# Patient Record
Sex: Male | Born: 1980 | Hispanic: Yes | Marital: Single | Smoking: Never smoker
Health system: Southern US, Community
[De-identification: ages and names within clinical notes are randomized; demographics above are authoritative.]

## PROBLEM LIST (undated history)

## (undated) DIAGNOSIS — E119 Type 2 diabetes mellitus without complications: Secondary | ICD-10-CM

## (undated) DIAGNOSIS — I10 Essential (primary) hypertension: Secondary | ICD-10-CM

## (undated) DIAGNOSIS — Z87898 Personal history of other specified conditions: Secondary | ICD-10-CM

## (undated) DIAGNOSIS — E785 Hyperlipidemia, unspecified: Secondary | ICD-10-CM

---

## 2018-11-05 ENCOUNTER — Encounter (HOSPITAL_COMMUNITY): Payer: Self-pay

## 2018-11-05 ENCOUNTER — Other Ambulatory Visit: Payer: Self-pay

## 2018-11-05 ENCOUNTER — Emergency Department (HOSPITAL_COMMUNITY)
Admission: EM | Admit: 2018-11-05 | Discharge: 2018-11-05 | Disposition: A | Discharge status: Home / Self Care | Payer: Self-pay | Attending: Emergency Medicine | Admitting: Emergency Medicine

## 2018-11-05 DIAGNOSIS — R6884 Jaw pain: Secondary | ICD-10-CM | POA: Insufficient documentation

## 2018-11-05 DIAGNOSIS — Z5321 Procedure and treatment not carried out due to patient leaving prior to being seen by health care provider: Secondary | ICD-10-CM | POA: Insufficient documentation

## 2018-11-05 DIAGNOSIS — H9202 Otalgia, left ear: Secondary | ICD-10-CM | POA: Insufficient documentation

## 2018-11-05 HISTORY — DX: Type 2 diabetes mellitus without complications: E11.9

## 2018-11-05 NOTE — ED Triage Notes (Signed)
Pt arrived with complaints of left ear pain that started about a month ago, per pt pain radiates to his left jaw and worsens when he eats.

## 2019-01-13 ENCOUNTER — Encounter (HOSPITAL_COMMUNITY): Payer: Self-pay | Admitting: Emergency Medicine

## 2019-01-13 ENCOUNTER — Other Ambulatory Visit: Payer: Self-pay

## 2019-01-13 ENCOUNTER — Emergency Department (HOSPITAL_COMMUNITY): Payer: Self-pay

## 2019-01-13 ENCOUNTER — Inpatient Hospital Stay (HOSPITAL_COMMUNITY)
Admission: EM | Admit: 2019-01-13 | Discharge: 2019-01-16 | DRG: 637 | Disposition: A | Discharge status: Home / Self Care | Payer: Self-pay | Attending: Internal Medicine | Admitting: Internal Medicine

## 2019-01-13 DIAGNOSIS — S43025A Posterior dislocation of left humerus, initial encounter: Secondary | ICD-10-CM | POA: Diagnosis present

## 2019-01-13 DIAGNOSIS — R569 Unspecified convulsions: Secondary | ICD-10-CM | POA: Diagnosis present

## 2019-01-13 DIAGNOSIS — E1110 Type 2 diabetes mellitus with ketoacidosis without coma: Principal | ICD-10-CM

## 2019-01-13 DIAGNOSIS — Z20828 Contact with and (suspected) exposure to other viral communicable diseases: Secondary | ICD-10-CM | POA: Diagnosis present

## 2019-01-13 DIAGNOSIS — G9341 Metabolic encephalopathy: Secondary | ICD-10-CM | POA: Diagnosis present

## 2019-01-13 DIAGNOSIS — W19XXXA Unspecified fall, initial encounter: Secondary | ICD-10-CM | POA: Diagnosis present

## 2019-01-13 DIAGNOSIS — S43024A Posterior dislocation of right humerus, initial encounter: Secondary | ICD-10-CM | POA: Diagnosis present

## 2019-01-13 DIAGNOSIS — S43022A Posterior subluxation of left humerus, initial encounter: Secondary | ICD-10-CM

## 2019-01-13 DIAGNOSIS — E1310 Other specified diabetes mellitus with ketoacidosis without coma: Secondary | ICD-10-CM

## 2019-01-13 DIAGNOSIS — R52 Pain, unspecified: Secondary | ICD-10-CM

## 2019-01-13 DIAGNOSIS — Z794 Long term (current) use of insulin: Secondary | ICD-10-CM

## 2019-01-13 DIAGNOSIS — S43021A Posterior subluxation of right humerus, initial encounter: Secondary | ICD-10-CM

## 2019-01-13 DIAGNOSIS — E119 Type 2 diabetes mellitus without complications: Secondary | ICD-10-CM

## 2019-01-13 DIAGNOSIS — Z87898 Personal history of other specified conditions: Secondary | ICD-10-CM

## 2019-01-13 DIAGNOSIS — D72829 Elevated white blood cell count, unspecified: Secondary | ICD-10-CM | POA: Diagnosis present

## 2019-01-13 DIAGNOSIS — I10 Essential (primary) hypertension: Secondary | ICD-10-CM | POA: Diagnosis present

## 2019-01-13 DIAGNOSIS — R55 Syncope and collapse: Secondary | ICD-10-CM | POA: Diagnosis present

## 2019-01-13 HISTORY — DX: Personal history of other specified conditions: Z87.898

## 2019-01-13 HISTORY — DX: Essential (primary) hypertension: I10

## 2019-01-13 LAB — URINALYSIS, ROUTINE W REFLEX MICROSCOPIC
Bilirubin Urine: NEGATIVE
Glucose, UA: >=500 mg/dL — AB
Ketones, ur: 20 mg/dL — AB
Leukocytes,Ua: NEGATIVE
Nitrite: NEGATIVE
Protein, ur: 30 mg/dL — AB
Specific Gravity, Urine: 1.02 (ref 1.005–1.030)
pH: 5 (ref 5.0–8.0)

## 2019-01-13 LAB — CBG MONITORING, ED
Glucose-Capillary: 334 mg/dL — ABNORMAL HIGH (ref 70–99)
Glucose-Capillary: 336 mg/dL — ABNORMAL HIGH (ref 70–99)
Glucose-Capillary: 336 mg/dL — ABNORMAL HIGH (ref 70–99)

## 2019-01-13 LAB — POCT I-STAT EG7
Acid-base deficit: 2 mmol/L (ref 0.0–2.0)
Bicarbonate: 22.8 mmol/L (ref 20.0–28.0)
Calcium, Ion: 1.11 mmol/L — ABNORMAL LOW (ref 1.15–1.40)
HCT: 52 % (ref 39.0–52.0)
Hemoglobin: 17.7 g/dL — ABNORMAL HIGH (ref 13.0–17.0)
O2 Saturation: 62 %
Potassium: 4.4 mmol/L (ref 3.5–5.1)
Sodium: 134 mmol/L — ABNORMAL LOW (ref 135–145)
TCO2: 24 mmol/L (ref 22–32)
pCO2, Ven: 39.8 mmHg — ABNORMAL LOW (ref 44.0–60.0)
pH, Ven: 7.367 (ref 7.250–7.430)
pO2, Ven: 33 mmHg (ref 32.0–45.0)

## 2019-01-13 LAB — CBC
HCT: 48.8 % (ref 39.0–52.0)
Hemoglobin: 17.3 g/dL — ABNORMAL HIGH (ref 13.0–17.0)
MCH: 31.2 pg (ref 26.0–34.0)
MCHC: 35.5 g/dL (ref 30.0–36.0)
MCV: 87.9 fL (ref 80.0–100.0)
Platelets: 364 10*3/uL (ref 150–400)
RBC: 5.55 MIL/uL (ref 4.22–5.81)
RDW: 11.8 % (ref 11.5–15.5)
WBC: 14.5 10*3/uL — ABNORMAL HIGH (ref 4.0–10.5)
nRBC: 0 % (ref 0.0–0.2)

## 2019-01-13 LAB — BASIC METABOLIC PANEL
Anion gap: 16 — ABNORMAL HIGH (ref 5–15)
BUN: 14 mg/dL (ref 6–20)
CO2: 19 mmol/L — ABNORMAL LOW (ref 22–32)
Calcium: 9.8 mg/dL (ref 8.9–10.3)
Chloride: 97 mmol/L — ABNORMAL LOW (ref 98–111)
Creatinine, Ser: 1.01 mg/dL (ref 0.61–1.24)
GFR calc Af Amer: >60 mL/min (ref >60)
GFR calc non Af Amer: >60 mL/min (ref >60)
Glucose, Bld: 385 mg/dL — ABNORMAL HIGH (ref 70–99)
Potassium: 4 mmol/L (ref 3.5–5.1)
Sodium: 132 mmol/L — ABNORMAL LOW (ref 135–145)

## 2019-01-13 MED ORDER — FENTANYL CITRATE (PF) 100 MCG/2ML IJ SOLN
50.0000 ug | Freq: Once | INTRAMUSCULAR | Status: AC
  Administered 2019-01-13: 50 ug via INTRAVENOUS
  Filled 2019-01-13: qty 2

## 2019-01-13 MED ORDER — DEXTROSE-NACL 5-0.45 % IV SOLN: INTRAVENOUS | Status: DC

## 2019-01-13 MED ORDER — INSULIN REGULAR(HUMAN) IN NACL 100-0.9 UT/100ML-% IV SOLN
INTRAVENOUS | Status: DC
  Administered 2019-01-13: 2.8 [IU]/h via INTRAVENOUS
  Filled 2019-01-13: qty 100

## 2019-01-13 MED ORDER — LACTATED RINGERS IV BOLUS
1000.0000 mL | Freq: Once | INTRAVENOUS | Status: AC
  Administered 2019-01-13: 1000 mL via INTRAVENOUS

## 2019-01-13 MED ORDER — PROPOFOL 10 MG/ML IV BOLUS
INTRAVENOUS | Status: AC | PRN
  Administered 2019-01-13: 20 mg via INTRAVENOUS
  Administered 2019-01-13: 35 mg via INTRAVENOUS
  Administered 2019-01-13: 30 mg via INTRAVENOUS

## 2019-01-13 MED ORDER — PROPOFOL 10 MG/ML IV BOLUS
150.0000 mg | Freq: Once | INTRAVENOUS | Status: AC
  Administered 2019-01-13: 22:00:00 via INTRAVENOUS
  Filled 2019-01-13: qty 20

## 2019-01-13 MED ORDER — PROPOFOL 10 MG/ML IV BOLUS
INTRAVENOUS | Status: AC | PRN
  Administered 2019-01-13: 75 mg via INTRAVENOUS
  Administered 2019-01-13: 20 mg via INTRAVENOUS

## 2019-01-13 MED ORDER — SODIUM CHLORIDE 0.9% FLUSH: 3.0000 mL | Freq: Once | INTRAVENOUS | Status: DC

## 2019-01-13 MED ORDER — LACTATED RINGERS IV BOLUS
1000.0000 mL | Freq: Once | INTRAVENOUS | Status: AC
  Administered 2019-01-13: 22:00:00 1000 mL via INTRAVENOUS

## 2019-01-13 NOTE — ED Notes (Signed)
Patient transported to CT and X ray 

## 2019-01-13 NOTE — H&P (Signed)
History and Physical    Alan Alvarez GEX:[id number] DOB: 1981-01-16 DOA: 01/13/2019  PCP: Patient, No Pcp Per   Patient coming from: Home   Chief Complaint: LOC, bilateral shoulder pain   HPI: Alan Alvarez is a 38 y.o. male with medical history significant for type 2 diabetes mellitus, hypertension, and seizure, presenting to the emergency department after a loss of consciousness and complaining of bilateral shoulder pain.  Patient reports that he is been in his usual state of health, had an uneventful day, was sitting down and eating when he suffered an acute loss of consciousness.  Patient woke up with bilateral shoulder pain.  He felt dizzy just prior to the event.  There were no witnesses.  Patient reports having a seizure approximately 10 years ago, is not sure of the etiology, but states that he has never been on antiepileptics.  There was no associated chest pain or palpitations and the patient denies any recent fevers, chills, cough, or shortness of breath.  He denies any alcohol or illicit substance use.  ED Course: Upon arrival to the ED, patient is found to be afebrile, saturating well on room air, and with stable blood pressure.  EKG features sinus rhythm with RBBB and LP FB.  Noncontrast head CT is a normal study.  Radiographs of the shoulders demonstrates bilateral posterior dislocations.  Chemistry panel features a glucose of 385 with elevated anion gap and low bicarbonate.  CBC features of leukocytosis 14,500.  Urinalysis notable for glucosuria and ketonuria.  Patient was given 2 L lactated Ringer's, fentanyl, and started on insulin infusion in the emergency department.  Bilateral shoulders were successfully reduced in the ED.  COVID-19 testing is in process.  Review of Systems:  All other systems reviewed and apart from HPI, are negative.  Past Medical History:  Diagnosis Date  . Diabetes mellitus without complication (HCC)   . History of seizure   . Hypertension     History  reviewed. No pertinent surgical history.   reports that he has never smoked. He has never used smokeless tobacco. He reports previous alcohol use. He reports previous drug use.  No Known Allergies  Family History  Problem Relation Age of Onset  . Diabetes Other   . Sudden Cardiac Death Neg Hx      Prior to Admission medications   Not on File    Physical Exam: Vitals:   01/13/19 2245 01/13/19 2300 01/13/19 2330 01/14/19 0015  BP: 136/78 138/85 135/82 (!) 159/108  Pulse: (!) 101 98 (!) 102 99  Resp: 18 (!) Temp:      TempSrc:      SpO2: 99% 98% 96% 97%  Weight:      Height:        Constitutional: NAD, calm  Eyes: PERTLA, lids and conjunctivae normal ENMT: Mucous membranes are moist. Posterior pharynx clear of any exudate or lesions.   Neck: normal, supple, no masses, no thyromegaly Respiratory: no wheezing, no crackles. Normal respiratory effort. No accessory muscle use.  Cardiovascular: S1 & S2 heard, regular rate and rhythm. No extremity edema.   Abdomen: No distension, no tenderness, soft. Bowel sounds active.  Musculoskeletal: no clubbing / cyanosis. No joint deformity upper and lower extremities.    Skin: no significant rashes, lesions, ulcers. Warm, dry, well-perfused. Neurologic: CN 2-12 grossly intact. Sensation intact. Strength 5/5 in all 4 limbs.  Psychiatric: Alert and oriented x 3. Pleasant, cooperative.    Labs on Admission: I have personally reviewed  following labs and imaging studies  CBC: Recent Labs  Lab 01/13/19 1945 01/13/19 2144  WBC 14.5*  --   HGB 17.3* 17.7*  HCT 48.8 52.0  MCV 87.9  --   PLT 364  --    Basic Metabolic Panel: Recent Labs  Lab 01/13/19 1945 01/13/19 2144  NA 132* 134*  K 4.0 4.4  CL 97*  --   CO2 19*  --   GLUCOSE 385*  --   BUN 14  --   CREATININE 1.01  --   CALCIUM 9.8  --    GFR: Estimated Creatinine Clearance: 90.4 mL/min (by C-G formula based on SCr of 1.01 mg/dL). Liver Function Tests: No  results for input(s): AST, ALT, ALKPHOS, BILITOT, PROT, ALBUMIN in the last 168 hours. No results for input(s): LIPASE, AMYLASE in the last 168 hours. No results for input(s): AMMONIA in the last 168 hours. Coagulation Profile: No results for input(s): INR, PROTIME in the last 168 hours. Cardiac Enzymes: No results for input(s): CKTOTAL, CKMB, CKMBINDEX, TROPONINI in the last 168 hours. BNP (last 3 results) No results for input(s): PROBNP in the last 8760 hours. HbA1C: No results for input(s): HGBA1C in the last 72 hours. CBG: Recent Labs  Lab 01/13/19 1944 01/13/19 2238 01/13/19 2347 01/14/19 0103  GLUCAP 334* 336* 336* 280*   Lipid Profile: No results for input(s): CHOL, HDL, LDLCALC, TRIG, CHOLHDL, LDLDIRECT in the last 72 hours. Thyroid Function Tests: No results for input(s): TSH, T4TOTAL, FREET4, T3FREE, THYROIDAB in the last 72 hours. Anemia Panel: No results for input(s): VITAMINB12, FOLATE, FERRITIN, TIBC, IRON, RETICCTPCT in the last 72 hours. Urine analysis:    Component Value Date/Time   COLORURINE STRAW (A) 01/13/2019 2120   APPEARANCEUR CLEAR 01/13/2019 2120   LABSPEC 1.020 01/13/2019 2120   PHURINE 5.0 01/13/2019 2120   GLUCOSEU >=500 (A) 01/13/2019 2120   HGBUR SMALL (A) 01/13/2019 2120   BILIRUBINUR NEGATIVE 01/13/2019 2120   KETONESUR 20 (A) 01/13/2019 2120   PROTEINUR 30 (A) 01/13/2019 2120   NITRITE NEGATIVE 01/13/2019 2120   LEUKOCYTESUR NEGATIVE 01/13/2019 2120   Sepsis Labs: @LABRCNTIP (procalcitonin:4,lacticidven:4) ) Recent Results (from the past 240 hour(s))  SARS Coronavirus 2 [facility] order, Performed in [facility] hospital lab) Nasopharyngeal Nasopharyngeal Swab     Status: None   Collection Time: 01/13/19 10:27 PM   Specimen: Nasopharyngeal Swab  Result Value Ref Range Status   SARS Coronavirus 2 NEGATIVE NEGATIVE Final    Comment: (NOTE) If result is NEGATIVE SARS-CoV-2 target nucleic acids are NOT DETECTED. The SARS-CoV-2 RNA is  generally detectable in upper and lower  respiratory specimens during the acute phase of infection. The lowest  concentration of SARS-CoV-2 viral copies this assay can detect is 250  copies / mL. A negative result does not preclude SARS-CoV-2 infection  and should not be used as the sole basis for treatment or other  patient management decisions.  A negative result may occur with  improper specimen collection / handling, submission of specimen other  than nasopharyngeal swab, presence of viral mutation(s) within the  areas targeted by this assay, and inadequate number of viral copies  (<250 copies / mL). A negative result must be combined with clinical  observations, patient history, and epidemiological information. If result is POSITIVE SARS-CoV-2 target nucleic acids are DETECTED. The SARS-CoV-2 RNA is generally detectable in upper and lower  respiratory specimens dur ing the acute phase of infection.  Positive  results are indicative of active infection with SARS-CoV-2.  Clinical  correlation with patient history and other diagnostic information is  necessary to determine patient infection status.  Positive results do  not rule out bacterial infection or co-infection with other viruses. If result is PRESUMPTIVE POSTIVE SARS-CoV-2 nucleic acids MAY BE PRESENT.   A presumptive positive result was obtained on the submitted specimen  and confirmed on repeat testing.  While 2019 novel coronavirus  (SARS-CoV-2) nucleic acids may be present in the submitted sample  additional confirmatory testing may be necessary for epidemiological  and / or clinical management purposes  to differentiate between  SARS-CoV-2 and other Sarbecovirus currently known to infect humans.  If clinically indicated additional testing with an alternate test  methodology [phone]) is advised. The SARS-CoV-2 RNA is generally  detectable in upper and lower respiratory sp ecimens during the acute  phase of infection.  The expected result is Negative. Fact Sheet for Patients:  BoilerBrush.com.cy Fact Sheet for Healthcare Providers: https://pope.com/ This test is not yet approved or cleared by the [place] FDA and has been authorized for detection and/or diagnosis of SARS-CoV-2 by FDA under an Emergency Use Authorization (EUA).  This EUA will remain in effect (meaning this test can be used) for the duration of the COVID-19 declaration under Section 564(b)(1) of the Act, 21 U.S.C. section 360bbb-3(b)(1), unless the authorization is terminated or revoked sooner. Performed at [facility], [address] [place], [place] [address]      Radiological Exams on Admission: Dg Shoulder Right  Result Date: 01/13/2019 CLINICAL DATA:  Bilateral arm pain following seizure. EXAM: RIGHT SHOULDER - 2+ VIEW COMPARISON:  None. FINDINGS: Findings suspicious for posterior dislocation at the right glenohumeral joint. Appearance of the humeral head suspicious for reverse Hill-Sachs injury. AC joint intact. IMPRESSION: Findings suspicious for posterior right shoulder dislocation with reverse Hill-Sachs injury. Electronically Signed   By: Charlett Nose M.D.   On: 01/13/2019 20:59   Ct Head Wo Contrast  Result Date: 01/13/2019 CLINICAL DATA:  Syncope. EXAM: CT HEAD WITHOUT CONTRAST TECHNIQUE: Contiguous axial images were obtained from the base of the skull through the vertex without intravenous contrast. COMPARISON:  None. FINDINGS: Brain: No evidence of acute infarction, hemorrhage, hydrocephalus, extra-axial collection or mass lesion/mass effect. Vascular: No hyperdense vessel or unexpected calcification. Skull: Normal. Negative for fracture or focal lesion. Sinuses/Orbits: No acute finding. Other: None. IMPRESSION: 1. Normal noncontrast head CT. Electronically Signed   By: Obie Dredge M.D.   On: 01/13/2019 20:31   Dg Shoulder Left  Result Date: 01/13/2019 CLINICAL  DATA:  Seizure, bilateral arm pain EXAM: LEFT SHOULDER - 2+ VIEW COMPARISON:  None. FINDINGS: Posterior left shoulder dislocation seen. Findings concerning for reverse Hill-Sachs deformity. AC joint is intact. IMPRESSION: Posterior dislocation with reverse Hill-Sachs injury. Electronically Signed   By: Charlett Nose M.D.   On: 01/13/2019 21:00   Dg Shoulder Left Portable  Result Date: 01/13/2019 CLINICAL DATA:  Postreduction EXAM: LEFT SHOULDER - 1 VIEW COMPARISON:  01/13/2019 FINDINGS: Interval reduction of the previously seen posterior left shoulder dislocation. Normal alignment. IMPRESSION: Interval reduction. Electronically Signed   By: Charlett Nose M.D.   On: 01/13/2019 22:27   Dg Shoulder Right Port  Result Date: 01/13/2019 CLINICAL DATA:  Postreduction EXAM: PORTABLE RIGHT SHOULDER COMPARISON:  01/13/2019 FINDINGS: Interval reduction of the previously seen posterior right shoulder dislocation. Normal alignment. IMPRESSION: Interval reduction. Electronically Signed   By: Charlett Nose M.D.   On: 01/13/2019 22:28    EKG: Independently reviewed. Sinus rhythm, RBBB, LPFB.  Assessment/Plan   1. Transient LOC  - Presents after an episode of LOC that occurred while seated and was unwitnessed  - He reports hx of seizure 1 yr ago, unsure of etiology but reports never being started on antiepileptic  - Could have been a seizure, possibly precipitated by DKA; he has abnormal EKG and syncope is also on DDx  - Continue cardiac monitoring, seizure precautions, check EEG, echocardiogram, and MRI brain    2. Mild DKA; type II DM - Presents following a transient LOC and is found to have hyperglycemia with metabolic acidosis and ketonuria  - He reports taking oral medication only for T2DM, reports adherence but also reports CBG's have been high recently  - No A1c on file  - He was fluid-resuscitated and started on insulin infusion in ED  - Continue insulin infusion with frequent CBG's and serial chem  panels, check A1c, continue IVF, transition to sq once DKA resolved and he is tolerating a diet    3. Hypertension  - Patient reports taking antihypertensives but not sure of the name, pharmacy med-rec pending, will treat as needed with labetalol IVP's for now    4. Bilateral shoulder dislocations  - Successfully reduced in ED  - Continue immobilization, outpatient orthopedic follow-up    PPE: Mask, face shield. Patient wearing mask.  DVT prophylaxis: Lovenox  Code Status: Full  Family Communication: Discussed with patient  Consults called: none  Admission status: Observation     Briscoe Deutscherimothy S Opyd, MD [facility] Pager [phone]  If 7PM-7AM, please contact night-coverage [email or web address] Password [email or web address]  01/14/2019, 1:08 AM

## 2019-01-13 NOTE — ED Notes (Signed)
Ortho paged for relocation

## 2019-01-13 NOTE — Progress Notes (Signed)
Orthopedic Tech Progress Note Patient Details:  Alan Alvarez 1980-11-29 [id number]  Ortho Devices Type of Ortho Device: Shoulder immobilizer Ortho Device/Splint Interventions: Adjustment, Application, Ordered   Post Interventions Patient Tolerated: Well   Melony Overly T 01/13/2019, 10:58 PM

## 2019-01-13 NOTE — ED Provider Notes (Signed)
[facility] EMERGENCY DEPARTMENT Provider Note   CSN: [id number] Arrival date & time: 01/13/19  1936     History   Chief Complaint Chief Complaint  Patient presents with  . Loss of Consciousness    HPI Alan Alvarez is a 38 y.o. male.      Loss of Consciousness Episode history:  Single Most recent episode:  Today Timing:  Intermittent Progression:  Resolved Chronicity:  New Context: not blood draw and not sitting down   Witnessed: no   Relieved by:  Nothing Worsened by:  Nothing Ineffective treatments:  None tried Associated symptoms: headaches   Associated symptoms: no anxiety and no confusion   Risk factors: seizures     Past Medical History:  Diagnosis Date  . Diabetes mellitus without complication ([place])   . History of seizure     Patient Active Problem List   Diagnosis Date Noted  . DKA (diabetic ketoacidoses) ([place]) 01/13/2019  . Diabetes mellitus without complication ([place])   . History of seizure   . Posterior dislocation of left shoulder joint   . Posterior dislocation of right shoulder joint     History reviewed. No pertinent surgical history.      Home Medications    Prior to Admission medications   Not on File    Family History History reviewed. No pertinent family history.  Social History Social History   Tobacco Use  . Smoking status: Never Smoker  . Smokeless tobacco: Never Used  Substance Use Topics  . Alcohol use: Not Currently    Frequency: Never  . Drug use: Not Currently     Allergies   Patient has no known allergies.   Review of Systems Review of Systems  Cardiovascular: Positive for syncope.  Musculoskeletal: Positive for arthralgias.  Neurological: Positive for headaches.  Psychiatric/Behavioral: Negative for confusion.  All other systems reviewed and are negative.    Physical Exam Updated Vital Signs BP 138/85   Pulse 98   Temp 98.6 F (37 C) (Axillary)   Resp (!) 21   Ht 5\' 6"   (1.676 m)   Wt 74.2 kg   SpO2 98%   BMI 26.40 kg/m   Physical Exam Vitals signs and nursing note reviewed.  Constitutional:      Appearance: He is well-developed.  HENT:     Head: Normocephalic and atraumatic.     Mouth/Throat:     Mouth: Mucous membranes are dry.     Pharynx: Oropharynx is clear.  Neck:     Musculoskeletal: Normal range of motion.  Cardiovascular:     Rate and Rhythm: Normal rate.  Pulmonary:     Effort: Pulmonary effort is normal. No respiratory distress.  Abdominal:     General: There is no distension.  Musculoskeletal: Normal range of motion.        General: Deformity (bilateral shoulders) present. No swelling.  Skin:    General: Skin is warm and dry.  Neurological:     General: No focal deficit present.     Mental Status: He is alert.      ED Treatments / Results  Labs (all labs ordered are listed, but only abnormal results are displayed) Labs Reviewed  BASIC METABOLIC PANEL - Abnormal; Notable for the following components:      Result Value   Sodium 132 (*)    Chloride 97 (*)    CO2 19 (*)    Glucose, Bld 385 (*)    Anion gap 16 (*)  All other components within normal limits  CBC - Abnormal; Notable for the following components:   WBC 14.5 (*)    Hemoglobin 17.3 (*)    All other components within normal limits  URINALYSIS, ROUTINE W REFLEX MICROSCOPIC - Abnormal; Notable for the following components:   Color, Urine STRAW (*)    Glucose, UA >=500 (*)    Hgb urine dipstick SMALL (*)    Ketones, ur 20 (*)    Protein, ur 30 (*)    Bacteria, UA RARE (*)    All other components within normal limits  CBG MONITORING, ED - Abnormal; Notable for the following components:   Glucose-Capillary 334 (*)    All other components within normal limits  POCT I-STAT EG7 - Abnormal; Notable for the following components:   pCO2, Ven 39.8 (*)    Sodium 134 (*)    Calcium, Ion 1.11 (*)    Hemoglobin 17.7 (*)    All other components within normal  limits  CBG MONITORING, ED - Abnormal; Notable for the following components:   Glucose-Capillary 336 (*)    All other components within normal limits  SARS CORONAVIRUS 2 (HOSPITAL ORDER, PERFORMED IN [facility])  BLOOD GAS, VENOUS    EKG EKG Interpretation  Date/Time:  Thursday January 13 2019 19:43:19 EDT Ventricular Rate:  97 PR Interval:    QRS Duration: 141 QT Interval:  399 QTC Calculation: 507 R Axis:   139 Text Interpretation:  Sinus rhythm Borderline short PR interval RBBB and LPFB No old tracing to compare Confirmed by Marily Memos [phone]) on 01/13/2019 8:50:26 PM   Radiology Dg Shoulder Right  Result Date: 01/13/2019 CLINICAL DATA:  Bilateral arm pain following seizure. EXAM: RIGHT SHOULDER - 2+ VIEW COMPARISON:  None. FINDINGS: Findings suspicious for posterior dislocation at the right glenohumeral joint. Appearance of the humeral head suspicious for reverse Hill-Sachs injury. AC joint intact. IMPRESSION: Findings suspicious for posterior right shoulder dislocation with reverse Hill-Sachs injury. Electronically Signed   By: Charlett Nose M.D.   On: 01/13/2019 20:59   Ct Head Wo Contrast  Result Date: 01/13/2019 CLINICAL DATA:  Syncope. EXAM: CT HEAD WITHOUT CONTRAST TECHNIQUE: Contiguous axial images were obtained from the base of the skull through the vertex without intravenous contrast. COMPARISON:  None. FINDINGS: Brain: No evidence of acute infarction, hemorrhage, hydrocephalus, extra-axial collection or mass lesion/mass effect. Vascular: No hyperdense vessel or unexpected calcification. Skull: Normal. Negative for fracture or focal lesion. Sinuses/Orbits: No acute finding. Other: None. IMPRESSION: 1. Normal noncontrast head CT. Electronically Signed   By: Obie Dredge M.D.   On: 01/13/2019 20:31   Dg Shoulder Left  Result Date: 01/13/2019 CLINICAL DATA:  Seizure, bilateral arm pain EXAM: LEFT SHOULDER - 2+ VIEW COMPARISON:  None. FINDINGS: Posterior  left shoulder dislocation seen. Findings concerning for reverse Hill-Sachs deformity. AC joint is intact. IMPRESSION: Posterior dislocation with reverse Hill-Sachs injury. Electronically Signed   By: Charlett Nose M.D.   On: 01/13/2019 21:00   Dg Shoulder Left Portable  Result Date: 01/13/2019 CLINICAL DATA:  Postreduction EXAM: LEFT SHOULDER - 1 VIEW COMPARISON:  01/13/2019 FINDINGS: Interval reduction of the previously seen posterior left shoulder dislocation. Normal alignment. IMPRESSION: Interval reduction. Electronically Signed   By: Charlett Nose M.D.   On: 01/13/2019 22:27   Dg Shoulder Right Port  Result Date: 01/13/2019 CLINICAL DATA:  Postreduction EXAM: PORTABLE RIGHT SHOULDER COMPARISON:  01/13/2019 FINDINGS: Interval reduction of the previously seen posterior right shoulder dislocation. Normal alignment. IMPRESSION:  Interval reduction. Electronically Signed   By: Charlett NoseKevin  Dover M.D.   On: 01/13/2019 22:28    Procedures .Critical Care Performed by: Marily MemosMesner, Allyn Bertoni, MD Authorized by: Marily MemosMesner, Kennady Zimmerle, MD   Critical care provider statement:    Critical care time (minutes):  45   Critical care was necessary to treat or prevent imminent or life-threatening deterioration of the following conditions:  Endocrine crisis and dehydration   Critical care was time spent personally by me on the following activities:  Discussions with consultants, evaluation of patient's response to treatment, examination of patient, ordering and performing treatments and interventions, ordering and review of laboratory studies, ordering and review of radiographic studies, pulse oximetry, re-evaluation of patient's condition, obtaining history from patient or surrogate and review of old charts   I assumed direction of critical care for this patient from another provider in my specialty: no   .Sedation  Date/Time: 01/13/2019 11:49 PM Performed by: Marily MemosMesner, Priti Consoli, MD Authorized by: Marily MemosMesner, Selassie Spatafore, MD   Consent:     Consent obtained:  Verbal   Consent given by:  Patient   Risks discussed:  Allergic reaction, dysrhythmia, inadequate sedation, nausea, prolonged hypoxia resulting in organ damage, prolonged sedation necessitating reversal, respiratory compromise necessitating ventilatory assistance and intubation and vomiting   Alternatives discussed:  Analgesia without sedation, anxiolysis and regional anesthesia Universal protocol:    Procedure explained and questions answered to patient or proxy's satisfaction: yes     Relevant documents present and verified: yes     Test results available and properly labeled: yes     Imaging studies available: yes     Required blood products, implants, devices, and special equipment available: yes     Site/side marked: yes     Immediately prior to procedure a time out was called: yes     Patient identity confirmation method:  Verbally with patient Indications:    Procedure necessitating sedation performed by:  Physician performing sedation Pre-sedation assessment:    Time since last food or drink:  2 hours   ASA classification: class 1 - normal, healthy patient     Neck mobility: normal     Mouth opening:  3 or more finger widths   Thyromental distance:  4 finger widths   Mallampati score:  I - soft palate, uvula, fauces, pillars visible   Pre-sedation assessments completed and reviewed: airway patency, cardiovascular function, hydration status, mental status, nausea/vomiting, pain level, respiratory function and temperature   Immediate pre-procedure details:    Reassessment: Patient reassessed immediately prior to procedure     Reviewed: vital signs, relevant labs/tests and NPO status     Verified: bag valve mask available, emergency equipment available, intubation equipment available, IV patency confirmed, oxygen available and suction available   Procedure details (see MAR for exact dosages):    Preoxygenation:  Nasal cannula   Sedation:  Propofol    Intra-procedure monitoring:  Blood pressure monitoring, cardiac monitor, continuous pulse oximetry, frequent LOC assessments, frequent vital sign checks and continuous capnometry   Intra-procedure events: none     Total Provider sedation time (minutes):  19 Post-procedure details:    Attendance: Constant attendance by certified staff until patient recovered     Recovery: Patient returned to pre-procedure baseline     Post-sedation assessments completed and reviewed: airway patency, cardiovascular function, hydration status, mental status, nausea/vomiting, pain level, respiratory function and temperature     Patient is stable for discharge or admission: yes     Patient tolerance:  Tolerated well, no  immediate complications   (including critical care time)  Medications Ordered in ED Medications  sodium chloride flush (NS) 0.9 % injection 3 mL (3 mLs Intravenous Not Given 01/13/19 2007)  dextrose 5 %-0.45 % sodium chloride infusion ( Intravenous Hold 01/13/19 2342)  insulin regular, human (MYXREDLIN) 100 units/ 100 mL infusion (has no administration in time range)  fentaNYL (SUBLIMAZE) injection 50 mcg (50 mcg Intravenous Given 01/13/19 2047)  lactated ringers bolus 1,000 mL (0 mLs Intravenous Stopped 01/13/19 2343)  propofol (DIPRIVAN) 10 mg/mL bolus/IV push 150 mg ( Intravenous Given 01/13/19 2208)  lactated ringers bolus 1,000 mL (0 mLs Intravenous Stopped 01/13/19 2342)  propofol (DIPRIVAN) 10 mg/mL bolus/IV push (20 mg Intravenous Given 01/13/19 2154)  propofol (DIPRIVAN) 10 mg/mL bolus/IV push (30 mg Intravenous Given 01/13/19 2202)     Initial Impression / Assessment and Plan / ED Course  I have reviewed the triage vital signs and the nursing notes.  Pertinent labs & imaging results that were available during my care of the patient were reviewed by me and considered in my medical decision making (see chart for details).  Clinical Course as of Jan 12 2345  Thu Jan 13, 2019  2031 CO2(!):  19 [HT]    Clinical Course User Index [HT] Jeanett Schlein       DKA  - insulin drip  Shoulder dislocations - reduced by resident under my direct supervision, ortho follow up.  Final Clinical Impressions(s) / ED Diagnoses   Final diagnoses:  Diabetic ketoacidosis without coma associated with other specified diabetes mellitus (HCC)  Posterior dislocation of left shoulder joint, initial encounter  Posterior dislocation of right shoulder joint, initial encounter    ED Discharge Orders    None       Giavanni Zeitlin, Barbara Cower, MD 01/13/19 2351

## 2019-01-13 NOTE — ED Notes (Signed)
[place], if pt could call her. She is also going to be his ride when discharged

## 2019-01-13 NOTE — ED Provider Notes (Signed)
Reduction of dislocation  Date/Time: 01/13/2019 10:12 PM Performed by: Regan Lemming, MD Authorized by: Regan Lemming, MD  Consent: Verbal consent obtained. Written consent obtained. Risks and benefits: risks, benefits and alternatives were discussed Consent given by: patient Required items: required blood products, implants, devices, and special equipment available Patient identity confirmed: verbally with patient and arm band Time out: Immediately prior to procedure a "time out" was called to verify the correct patient, procedure, equipment, support staff and site/side marked as required. Preparation: Patient was prepped and draped in the usual sterile fashion. Local anesthesia used: no  Anesthesia: Local anesthesia used: no  Sedation: Patient sedated: yes Sedatives: propofol and see MAR for details Sedation start date/time: 01/13/2019 9:57 PM Sedation end date/time: 01/13/2019 10:10 PM Vitals: Vital signs were monitored during sedation.  Patient tolerance: patient tolerated the procedure well with no immediate complications Comments: Dislocation reduction of the left shoulder was attempted under procedural sedation, was successful at reduction. Post procedure left shoulder XR showed interval reduction.       Regan Lemming, MD 01/13/19 4536    Merrily Pew, MD 01/13/19 2325

## 2019-01-13 NOTE — ED Notes (Signed)
Pt. A&O x4, able to do serial counts, VS stable

## 2019-01-13 NOTE — Sedation Documentation (Signed)
EDP with translator at bedside discussing procedure

## 2019-01-13 NOTE — ED Triage Notes (Signed)
Pt. BIB [facility] c/o loss of consciousness. Uncertain of events that led to LOC.Pt states he was eating when he began to feel dizzy. Pt is unable to recall any other events currently is c/o bilateral shoulder pain. Pt states HX of seizures. States that this feels similar to that event. Pt diaphoretic. HX DM   EMS 50 mcg Fentanyl

## 2019-01-13 NOTE — Sedation Documentation (Signed)
Tim RT and ortho at bedside

## 2019-01-13 NOTE — Sedation Documentation (Signed)
Right shoulder relocated at this time

## 2019-01-13 NOTE — ED Provider Notes (Signed)
Reduction of dislocation  Date/Time: 01/13/2019 10:07 PM Performed by: Regan Lemming, MD Authorized by: Regan Lemming, MD  Consent: Verbal consent obtained. Written consent obtained. Risks and benefits: risks, benefits and alternatives were discussed Consent given by: patient Required items: required blood products, implants, devices, and special equipment available Patient identity confirmed: verbally with patient and arm band Time out: Immediately prior to procedure a "time out" was called to verify the correct patient, procedure, equipment, support staff and site/side marked as required. Preparation: Patient was prepped and draped in the usual sterile fashion. Local anesthesia used: no  Anesthesia: Local anesthesia used: no  Sedation: Patient sedated: yes Sedatives: propofol and see MAR for details Sedation start date/time: 01/13/2019 9:57 PM Sedation end date/time: 01/13/2019 10:07 PM Vitals: Vital signs were monitored during sedation.  Patient tolerance: patient tolerated the procedure well with no immediate complications Comments: The right shoulder was successfully reduced via gentle traction and manipulation of the humeral head. Post reduction XR revealed interval reduction. The patient tolerated the procedure well without complication.        Regan Lemming, MD 01/13/19 [id number]    Merrily Pew, MD 01/13/19 2328

## 2019-01-14 ENCOUNTER — Encounter (HOSPITAL_COMMUNITY): Payer: Self-pay | Admitting: Family Medicine

## 2019-01-14 ENCOUNTER — Observation Stay (HOSPITAL_COMMUNITY): Payer: Self-pay

## 2019-01-14 ENCOUNTER — Inpatient Hospital Stay (HOSPITAL_COMMUNITY): Payer: Self-pay

## 2019-01-14 DIAGNOSIS — G9341 Metabolic encephalopathy: Secondary | ICD-10-CM

## 2019-01-14 DIAGNOSIS — I10 Essential (primary) hypertension: Secondary | ICD-10-CM | POA: Diagnosis present

## 2019-01-14 DIAGNOSIS — R55 Syncope and collapse: Secondary | ICD-10-CM

## 2019-01-14 LAB — BASIC METABOLIC PANEL
Anion gap: 14 (ref 5–15)
Anion gap: 12 (ref 5–15)
BUN: 17 mg/dL (ref 6–20)
BUN: 11 mg/dL (ref 6–20)
CO2: 23 mmol/L (ref 22–32)
CO2: 24 mmol/L (ref 22–32)
Calcium: 9.7 mg/dL (ref 8.9–10.3)
Calcium: 9.3 mg/dL (ref 8.9–10.3)
Chloride: 100 mmol/L (ref 98–111)
Chloride: 101 mmol/L (ref 98–111)
Creatinine, Ser: 0.99 mg/dL (ref 0.61–1.24)
Creatinine, Ser: 0.83 mg/dL (ref 0.61–1.24)
GFR calc Af Amer: >60 mL/min (ref >60)
GFR calc Af Amer: >60 mL/min (ref >60)
GFR calc non Af Amer: >60 mL/min (ref >60)
GFR calc non Af Amer: >60 mL/min (ref >60)
Glucose, Bld: 249 mg/dL — ABNORMAL HIGH (ref 70–99)
Glucose, Bld: 190 mg/dL — ABNORMAL HIGH (ref 70–99)
Potassium: 3.9 mmol/L (ref 3.5–5.1)
Potassium: 3.5 mmol/L (ref 3.5–5.1)
Sodium: 137 mmol/L (ref 135–145)
Sodium: 137 mmol/L (ref 135–145)

## 2019-01-14 LAB — CBG MONITORING, ED
Glucose-Capillary: 280 mg/dL — ABNORMAL HIGH (ref 70–99)
Glucose-Capillary: 205 mg/dL — ABNORMAL HIGH (ref 70–99)
Glucose-Capillary: 208 mg/dL — ABNORMAL HIGH (ref 70–99)
Glucose-Capillary: 194 mg/dL — ABNORMAL HIGH (ref 70–99)
Glucose-Capillary: 174 mg/dL — ABNORMAL HIGH (ref 70–99)
Glucose-Capillary: 153 mg/dL — ABNORMAL HIGH (ref 70–99)
Glucose-Capillary: 174 mg/dL — ABNORMAL HIGH (ref 70–99)
Glucose-Capillary: 187 mg/dL — ABNORMAL HIGH (ref 70–99)
Glucose-Capillary: 233 mg/dL — ABNORMAL HIGH (ref 70–99)
Glucose-Capillary: 216 mg/dL — ABNORMAL HIGH (ref 70–99)

## 2019-01-14 LAB — HIV ANTIBODY (ROUTINE TESTING W REFLEX): HIV Screen 4th Generation wRfx: NONREACTIVE

## 2019-01-14 LAB — ECHOCARDIOGRAM COMPLETE
Height: 66 in
Weight: 2617.3 oz

## 2019-01-14 LAB — GLUCOSE, CAPILLARY
Glucose-Capillary: 194 mg/dL — ABNORMAL HIGH (ref 70–99)
Glucose-Capillary: 252 mg/dL — ABNORMAL HIGH (ref 70–99)

## 2019-01-14 LAB — HEMOGLOBIN A1C
Hgb A1c MFr Bld: 10.8 % — ABNORMAL HIGH (ref 4.8–5.6)
Mean Plasma Glucose: 263.26 mg/dL

## 2019-01-14 LAB — SARS CORONAVIRUS 2 BY RT PCR (HOSPITAL ORDER, PERFORMED IN ~~LOC~~ HOSPITAL LAB): SARS Coronavirus 2: NEGATIVE

## 2019-01-14 MED ORDER — MORPHINE SULFATE (PF) 2 MG/ML IV SOLN
2.0000 mg | INTRAVENOUS | Status: DC | PRN
  Administered 2019-01-14 (×2): 4 mg via INTRAVENOUS
  Administered 2019-01-14: 2 mg via INTRAVENOUS
  Administered 2019-01-14 – 2019-01-16 (×8): 4 mg via INTRAVENOUS
  Filled 2019-01-14: qty 2
  Filled 2019-01-14: qty 1
  Filled 2019-01-14 (×9): qty 2

## 2019-01-14 MED ORDER — LABETALOL HCL 5 MG/ML IV SOLN
10.0000 mg | INTRAVENOUS | Status: DC | PRN
  Administered 2019-01-14: 10 mg via INTRAVENOUS
  Filled 2019-01-14: qty 4

## 2019-01-14 MED ORDER — SODIUM CHLORIDE 0.9 % IV SOLN: INTRAVENOUS | Status: DC

## 2019-01-14 MED ORDER — INSULIN ASPART 100 UNIT/ML ~~LOC~~ SOLN
0.0000 [IU] | Freq: Every day | SUBCUTANEOUS | Status: DC
  Administered 2019-01-15: 2 [IU] via SUBCUTANEOUS

## 2019-01-14 MED ORDER — INSULIN ASPART 100 UNIT/ML ~~LOC~~ SOLN
0.0000 [IU] | Freq: Three times a day (TID) | SUBCUTANEOUS | Status: DC
  Administered 2019-01-14: 3 [IU] via SUBCUTANEOUS
  Administered 2019-01-14: 5 [IU] via SUBCUTANEOUS
  Administered 2019-01-14: 3 [IU] via SUBCUTANEOUS
  Administered 2019-01-15 – 2019-01-16 (×4): 5 [IU] via SUBCUTANEOUS
  Administered 2019-01-16: 13:00:00 8 [IU] via SUBCUTANEOUS

## 2019-01-14 MED ORDER — POTASSIUM CHLORIDE 10 MEQ/100ML IV SOLN
10.0000 meq | INTRAVENOUS | Status: AC
  Administered 2019-01-14 (×2): 10 meq via INTRAVENOUS
  Filled 2019-01-14 (×2): qty 100

## 2019-01-14 MED ORDER — ENOXAPARIN SODIUM 40 MG/0.4ML ~~LOC~~ SOLN
40.0000 mg | SUBCUTANEOUS | Status: DC
  Administered 2019-01-14 – 2019-01-16 (×3): 40 mg via SUBCUTANEOUS
  Filled 2019-01-14 (×3): qty 0.4

## 2019-01-14 MED ORDER — INSULIN REGULAR(HUMAN) IN NACL 100-0.9 UT/100ML-% IV SOLN: INTRAVENOUS | Status: DC

## 2019-01-14 MED ORDER — LISINOPRIL 10 MG PO TABS
10.0000 mg | ORAL_TABLET | Freq: Every day | ORAL | Status: DC
  Administered 2019-01-14 – 2019-01-16 (×3): 10 mg via ORAL
  Filled 2019-01-14 (×3): qty 1

## 2019-01-14 MED ORDER — INSULIN GLARGINE 100 UNIT/ML ~~LOC~~ SOLN: 10.0000 [IU] | Freq: Every day | SUBCUTANEOUS | Status: DC

## 2019-01-14 MED ORDER — DEXTROSE-NACL 5-0.45 % IV SOLN
INTRAVENOUS | Status: DC
  Administered 2019-01-14: 02:00:00 via INTRAVENOUS

## 2019-01-14 MED ORDER — LORAZEPAM 1 MG PO TABS
1.0000 mg | ORAL_TABLET | Freq: Two times a day (BID) | ORAL | Status: DC | PRN
  Administered 2019-01-16: 1 mg via ORAL
  Filled 2019-01-14: qty 1

## 2019-01-14 MED ORDER — INSULIN GLARGINE 100 UNIT/ML ~~LOC~~ SOLN
10.0000 [IU] | Freq: Every day | SUBCUTANEOUS | Status: DC
  Administered 2019-01-14 – 2019-01-16 (×3): 10 [IU] via SUBCUTANEOUS
  Filled 2019-01-14 (×4): qty 0.1

## 2019-01-14 NOTE — ED Notes (Signed)
Ordered to stop IV insulin 2 hours post SQ insulin. SQ insulin given at 0800.

## 2019-01-14 NOTE — ED Notes (Signed)
Pt CBG 233 

## 2019-01-14 NOTE — ED Notes (Signed)
Alan Alvarez(SR-Lunch Tray Ordered)@ 1039.  

## 2019-01-14 NOTE — ED Notes (Signed)
SDU ordered bfast 

## 2019-01-14 NOTE — ED Notes (Signed)
ED TO INPATIENT HANDOFF REPORT  ED Nurse Name and Phone #: [phone]  S Name/Age/Gender Alan Alvarez 38 y.o. male Room/Bed: 012C/012C  Code Status   Code Status: Full Code  Home/SNF/Other Home Patient oriented to: self Is this baseline? Yes   Triage Complete: Triage complete  Chief Complaint syncope  Triage Note Pt. BIB [facility] c/o loss of consciousness. Uncertain of events that led to LOC.Pt states he was eating when he began to feel dizzy. Pt is unable to recall any other events currently is c/o bilateral shoulder pain. Pt states HX of seizures. States that this feels similar to that event. Pt diaphoretic. HX DM   EMS 50 mcg Fentanyl     Allergies No Known Allergies  Level of Care/Admitting Diagnosis ED Disposition    ED Disposition Condition Comment   Admit  Hospital Area: [place] [[id number]]  Level of Care: Telemetry Medical [104]  Covid Evaluation: Confirmed COVID Negative  Diagnosis: Acute metabolic encephalopathy [[id number]]  Admitting Physician: [place] ERIC J [[id number]]  Attending Physician: [place] ERIC J [[id number]]  Estimated length of stay: past midnight tomorrow  Certification:: I certify this patient will need inpatient services for at least 2 midnights  PT Class (Do Not Modify): Inpatient [101]  PT Acc Code (Do Not Modify): Private [1]       B Medical/Surgery History Past Medical History:  Diagnosis Date  . Diabetes mellitus without complication ([place])   . History of seizure   . Hypertension    History reviewed. No pertinent surgical history.   A IV Location/Drains/Wounds Patient Lines/Drains/Airways Status   Active Line/Drains/Airways    Name:   Placement date:   Placement time:   Site:   Days:   Peripheral IV 01/13/19 Anterior;Left Forearm   01/13/19    1939    Forearm   1   Peripheral IV 01/13/19 Right Forearm   01/13/19    2343    Forearm   1          Intake/Output Last 24 hours No intake or output data in the 24  hours ending 01/14/19 1440  Labs/Imaging Results for orders placed or performed during the hospital encounter of 01/13/19 (from the past 48 hour(s))  CBG monitoring, ED     Status: Abnormal   Collection Time: 01/13/19  7:44 PM  Result Value Ref Range   Glucose-Capillary 334 (H) 70 - 99 mg/dL  Basic metabolic panel     Status: Abnormal   Collection Time: 01/13/19  7:45 PM  Result Value Ref Range   Sodium 132 (L) 135 - 145 mmol/L   Potassium 4.0 3.5 - 5.1 mmol/L   Chloride 97 (L) 98 - 111 mmol/L   CO2 19 (L) 22 - 32 mmol/L   Glucose, Bld 385 (H) 70 - 99 mg/dL   BUN 14 6 - 20 mg/dL   Creatinine, Ser 1.01 0.61 - 1.24 mg/dL   Calcium 9.8 8.9 - 10.3 mg/dL   GFR calc non Af Amer >60 >60 mL/min   GFR calc Af Amer >60 >60 mL/min   Anion gap 16 (H) 5 - 15    Comment: Performed at [facility], [address] [place] [address]  CBC     Status: Abnormal   Collection Time: 01/13/19  7:45 PM  Result Value Ref Range   WBC 14.5 (H) 4.0 - 10.5 K/uL   RBC 5.55 4.22 - 5.81 MIL/uL   Hemoglobin 17.3 (H) 13.0 - 17.0 g/dL  HCT 48.8 39.0 - 52.0 %   MCV 87.9 80.0 - 100.0 fL   MCH 31.2 26.0 - 34.0 pg   MCHC 35.5 30.0 - 36.0 g/dL   RDW 16.1 09.6 - 04.5 %   Platelets 364 150 - 400 K/uL   nRBC 0.0 0.0 - 0.2 %    Comment: Performed at [facility], [address] [place], [place] [address]  Urinalysis, Routine w reflex microscopic     Status: Abnormal   Collection Time: 01/13/19  9:20 PM  Result Value Ref Range   Color, Urine STRAW (A) YELLOW   APPearance CLEAR CLEAR   Specific Gravity, Urine 1.020 1.005 - 1.030   pH 5.0 5.0 - 8.0   Glucose, UA >=500 (A) NEGATIVE mg/dL   Hgb urine dipstick SMALL (A) NEGATIVE   Bilirubin Urine NEGATIVE NEGATIVE   Ketones, ur 20 (A) NEGATIVE mg/dL   Protein, ur 30 (A) NEGATIVE mg/dL   Nitrite NEGATIVE NEGATIVE   Leukocytes,Ua NEGATIVE NEGATIVE   RBC / HPF 0-5 0 - 5 RBC/hpf   Bacteria, UA RARE (A) NONE SEEN    Comment: Performed at [facility], [address], [place], [place] [address]  POCT I-Stat EG7     Status: Abnormal   Collection Time: 01/13/19  9:44 PM  Result Value Ref Range   pH, Ven 7.367 7.250 - 7.430   pCO2, Ven 39.8 (L) 44.0 - 60.0 mmHg   pO2, Ven 33.0 32.0 - 45.0 mmHg   Bicarbonate 22.8 20.0 - 28.0 mmol/L   TCO2 24 22 - 32 mmol/L   O2 Saturation 62.0 %   Acid-base deficit 2.0 0.0 - 2.0 mmol/L   Sodium 134 (L) 135 - 145 mmol/L   Potassium 4.4 3.5 - 5.1 mmol/L   Calcium, Ion 1.11 (L) 1.15 - 1.40 mmol/L   HCT 52.0 39.0 - 52.0 %   Hemoglobin 17.7 (H) 13.0 - 17.0 g/dL   Patient temperature HIDE    Sample type VENOUS    Comment NOTIFIED PHYSICIAN   SARS Coronavirus 2 [facility] order, Performed in [facility] hospital lab) Nasopharyngeal Nasopharyngeal Swab     Status: None   Collection Time: 01/13/19 10:27 PM   Specimen: Nasopharyngeal Swab  Result Value Ref Range   SARS Coronavirus 2 NEGATIVE NEGATIVE    Comment: (NOTE) If result is NEGATIVE SARS-CoV-2 target nucleic acids are NOT DETECTED. The SARS-CoV-2 RNA is generally detectable in upper and lower  respiratory specimens during the acute phase of infection. The lowest  concentration of SARS-CoV-2 viral copies this assay can detect is 250  copies / mL. A negative result does not preclude SARS-CoV-2 infection  and should not be used as the sole basis for treatment or other  patient management decisions.  A negative result may occur with  improper specimen collection / handling, submission of specimen other  than nasopharyngeal swab, presence of viral mutation(s) within the  areas targeted by this assay, and inadequate number of viral copies  (<250 copies / mL). A negative result must be combined with clinical  observations, patient history, and epidemiological information. If result is POSITIVE SARS-CoV-2 target nucleic acids are DETECTED. The SARS-CoV-2 RNA is generally detectable in upper and lower  respiratory specimens dur ing the acute  phase of infection.  Positive  results are indicative of active infection with SARS-CoV-2.  Clinical  correlation with patient history and other diagnostic information is  necessary to determine patient infection status.  Positive results do  not rule out bacterial infection or co-infection with other viruses. If result is PRESUMPTIVE POSTIVE SARS-CoV-2 nucleic acids MAY BE PRESENT.   A presumptive positive result was obtained on the submitted specimen  and confirmed on repeat testing.  While 2019 novel coronavirus  (SARS-CoV-2) nucleic acids may be present in the submitted sample  additional confirmatory testing may be necessary for epidemiological  and / or clinical management purposes  to differentiate between  SARS-CoV-2 and other Sarbecovirus currently known to infect humans.  If clinically indicated additional testing with an alternate test  methodology [phone]) is advised. The SARS-CoV-2 RNA is generally  detectable in upper and lower respiratory sp ecimens during the acute  phase of infection. The expected result is Negative. Fact Sheet for Patients:  BoilerBrush.com.cy Fact Sheet for Healthcare Providers: https://pope.com/ This test is not yet approved or cleared by the [place] FDA and has been authorized for detection and/or diagnosis of SARS-CoV-2 by FDA under an Emergency Use Authorization (EUA).  This EUA will remain in effect (meaning this test can be used) for the duration of the COVID-19 declaration under Section 564(b)(1) of the Act, 21 U.S.C. section 360bbb-3(b)(1), unless the authorization is terminated or revoked sooner. Performed at [facility], [address] [place], [place] [address]   CBG monitoring, ED     Status: Abnormal   Collection Time: 01/13/19 10:38 PM  Result Value Ref Range   Glucose-Capillary 336 (H) 70 - 99 mg/dL  CBG monitoring, ED     Status: Abnormal   Collection Time: 01/13/19  11:47 PM  Result Value Ref Range   Glucose-Capillary 336 (H) 70 - 99 mg/dL  CBG monitoring, ED     Status: Abnormal   Collection Time: 01/14/19  1:03 AM  Result Value Ref Range   Glucose-Capillary 280 (H) 70 - 99 mg/dL  Basic metabolic panel     Status: Abnormal   Collection Time: 01/14/19  1:47 AM  Result Value Ref Range   Sodium 137 135 - 145 mmol/L   Potassium 3.9 3.5 - 5.1 mmol/L   Chloride 100 98 - 111 mmol/L   CO2 23 22 - 32 mmol/L   Glucose, Bld 249 (H) 70 - 99 mg/dL   BUN 17 6 - 20 mg/dL   Creatinine, Ser 1.47 0.61 - 1.24 mg/dL   Calcium 9.7 8.9 - 82.9 mg/dL   GFR calc non Af Amer >60 >60 mL/min   GFR calc Af Amer >60 >60 mL/min   Anion gap 14 5 - 15    Comment: Performed at [facility], [address], [place], [place] [address]  Hemoglobin A1c     Status: Abnormal   Collection Time: 01/14/19  1:47 AM  Result Value Ref Range   Hgb A1c MFr Bld 10.8 (H) 4.8 - 5.6 %    Comment: (NOTE) Pre diabetes:          5.7%-6.4% Diabetes:              >6.4% Glycemic control for   <7.0% adults with diabetes    Mean Plasma Glucose 263.26 mg/dL    Comment: Performed at [facility], [address], [place], [place] [address]  CBG monitoring, ED     Status: Abnormal   Collection Time: 01/14/19  2:08 AM  Result Value Ref Range   Glucose-Capillary 205 (H) 70 - 99 mg/dL  CBG monitoring, ED     Status: Abnormal   Collection Time: 01/14/19  4:16 AM  Result Value Ref Range   Glucose-Capillary 208 (H) 70 - 99 mg/dL  HIV Antibody (routine testing w rflx)     Status: None   Collection Time: 01/14/19  4:40 AM  Result Value Ref Range   HIV Screen 4th Generation wRfx Non Reactive Non Reactive    Comment: (NOTE) Performed At: [facility] [address] [place], [place] [id number] Jolene Schimke MD [id number]   CBG monitoring, ED     Status: Abnormal   Collection Time: 01/14/19  5:31 AM  Result Value Ref Range   Glucose-Capillary 194 (H) 70 - 99 mg/dL  CBG  monitoring, ED     Status: Abnormal   Collection Time: 01/14/19  6:42 AM  Result Value Ref Range   Glucose-Capillary 174 (H) 70 - 99 mg/dL  CBG monitoring, ED     Status: Abnormal   Collection Time: 01/14/19  7:46 AM  Result Value Ref Range   Glucose-Capillary 153 (H) 70 - 99 mg/dL  CBG monitoring, ED     Status: Abnormal   Collection Time: 01/14/19  8:57 AM  Result Value Ref Range   Glucose-Capillary 174 (H) 70 - 99 mg/dL  CBG monitoring, ED     Status: Abnormal   Collection Time: 01/14/19 10:10 AM  Result Value Ref Range   Glucose-Capillary 187 (H) 70 - 99 mg/dL  Basic metabolic panel     Status: Abnormal   Collection Time: 01/14/19 10:26 AM  Result Value Ref Range   Sodium 137 135 - 145 mmol/L   Potassium 3.5 3.5 - 5.1 mmol/L   Chloride 101 98 - 111 mmol/L   CO2 24 22 - 32 mmol/L   Glucose, Bld 190 (H) 70 - 99 mg/dL   BUN 11 6 - 20 mg/dL   Creatinine, Ser 9.17 0.61 - 1.24 mg/dL   Calcium 9.3 8.9 - 91.5 mg/dL   GFR calc non Af Amer >60 >60 mL/min   GFR calc Af Amer >60 >60 mL/min   Anion gap 12 5 - 15    Comment: Performed at [facility], [address], [place], [place] [address]  CBG monitoring, ED     Status: Abnormal   Collection Time: 01/14/19 12:12 PM  Result Value Ref Range   Glucose-Capillary 233 (H) 70 - 99 mg/dL  CBG monitoring, ED     Status: Abnormal   Collection Time: 01/14/19  1:38 PM  Result Value Ref Range   Glucose-Capillary 216 (H) 70 - 99 mg/dL   Dg Shoulder Right  Result Date: 01/13/2019 CLINICAL DATA:  Bilateral arm pain following seizure. EXAM: RIGHT SHOULDER - 2+ VIEW COMPARISON:  None. FINDINGS: Findings suspicious for posterior dislocation at the right glenohumeral joint. Appearance of the humeral head suspicious for reverse Hill-Sachs injury. AC joint intact. IMPRESSION: Findings suspicious for posterior right shoulder dislocation with reverse Hill-Sachs injury. Electronically Signed   By: Charlett Nose M.D.   On: 01/13/2019 20:59   Ct  Head Wo Contrast  Result Date: 01/13/2019 CLINICAL DATA:  Syncope. EXAM: CT HEAD WITHOUT CONTRAST TECHNIQUE: Contiguous axial images were obtained from the base of the skull through the vertex without intravenous contrast. COMPARISON:  None. FINDINGS: Brain: No evidence of acute infarction, hemorrhage, hydrocephalus, extra-axial collection or mass lesion/mass effect. Vascular: No hyperdense vessel or unexpected calcification. Skull: Normal. Negative for fracture or focal lesion. Sinuses/Orbits: No acute finding. Other: None. IMPRESSION: 1. Normal noncontrast head CT. Electronically Signed   By: Obie Dredge M.D.   On: 01/13/2019 20:31  Mr Brain Wo Contrast  Result Date: 01/14/2019 CLINICAL DATA:  Initial evaluation for acute altered mental status. EXAM: MRI HEAD WITHOUT CONTRAST TECHNIQUE: Multiplanar, multiecho pulse sequences of the brain and surrounding structures were obtained without intravenous contrast. COMPARISON:  Prior head CT from 01/13/2019. FINDINGS: Brain: Cerebral volume within normal limits for patient age. No focal parenchymal signal abnormality identified. No abnormal foci of restricted diffusion to suggest acute or subacute ischemia. Gray-white matter differentiation well maintained. No encephalomalacia to suggest chronic infarction. No foci of susceptibility artifact to suggest acute or chronic intracranial hemorrhage. No mass lesion, midline shift or mass effect. No hydrocephalus. No extra-axial fluid collection. Major dural sinuses are grossly patent. No intrinsic temporal lobe abnormality. Pituitary gland and suprasellar region are normal. Midline structures intact and normal. Vascular: Major intracranial vascular flow voids well maintained and normal in appearance. Skull and upper cervical spine: Craniocervical junction normal. Visualized upper cervical spine within normal limits. Bone marrow signal intensity normal. No scalp soft tissue abnormality. Sinuses/Orbits: Globes and  orbital soft tissues within normal limits. Paranasal sinuses are clear. No mastoid effusion. Inner ear structures normal. Other: None. IMPRESSION: Normal brain MRI.  No acute intracranial abnormality identified. Electronically Signed   By: Rise Mu M.D.   On: 01/14/2019 04:04   Dg Shoulder Left  Result Date: 01/13/2019 CLINICAL DATA:  Seizure, bilateral arm pain EXAM: LEFT SHOULDER - 2+ VIEW COMPARISON:  None. FINDINGS: Posterior left shoulder dislocation seen. Findings concerning for reverse Hill-Sachs deformity. AC joint is intact. IMPRESSION: Posterior dislocation with reverse Hill-Sachs injury. Electronically Signed   By: Charlett Nose M.D.   On: 01/13/2019 21:00   Dg Shoulder Left Portable  Result Date: 01/13/2019 CLINICAL DATA:  Postreduction EXAM: LEFT SHOULDER - 1 VIEW COMPARISON:  01/13/2019 FINDINGS: Interval reduction of the previously seen posterior left shoulder dislocation. Normal alignment. IMPRESSION: Interval reduction. Electronically Signed   By: Charlett Nose M.D.   On: 01/13/2019 22:27   Dg Shoulder Right Port  Result Date: 01/13/2019 CLINICAL DATA:  Postreduction EXAM: PORTABLE RIGHT SHOULDER COMPARISON:  01/13/2019 FINDINGS: Interval reduction of the previously seen posterior right shoulder dislocation. Normal alignment. IMPRESSION: Interval reduction. Electronically Signed   By: Charlett Nose M.D.   On: 01/13/2019 22:28    Pending Labs Unresulted Labs (From admission, onward)    Start     Ordered   01/20/19 0500  Creatinine, serum  (enoxaparin (LOVENOX)    CrCl >/= 30 ml/min)  Weekly,   R    Comments: while on enoxaparin therapy    01/14/19 0124   01/14/19 0500  Basic metabolic panel  Daily,   R     01/14/19 0459          Vitals/Pain Today's Vitals   01/14/19 1015 01/14/19 1252 01/14/19 1252 01/14/19 1304  BP: (!) 141/96  (!) 139/98   Pulse: 88  83   Resp: 19  12   Temp:      TempSrc:      SpO2: 97%  99%   Weight:      Height:      PainSc:  8    5     Isolation Precautions No active isolations  Medications Medications  sodium chloride flush (NS) 0.9 % injection 3 mL (3 mLs Intravenous Not Given 01/13/19 2007)  insulin regular, human (MYXREDLIN) 100 units/ 100 mL infusion (0 Units/hr Intravenous Stopped 01/14/19 1005)  enoxaparin (LOVENOX) injection 40 mg (40 mg Subcutaneous Given 01/14/19 1030)  dextrose 5 %-0.45 % sodium  chloride infusion ( Intravenous Stopped 01/14/19 1024)  morphine 2 MG/ML injection 2-4 mg (4 mg Intravenous Given 01/14/19 1252)  labetalol (NORMODYNE) injection 10 mg (10 mg Intravenous Given 01/14/19 0556)  insulin aspart (novoLOG) injection 0-15 Units (5 Units Subcutaneous Given 01/14/19 1303)  insulin aspart (novoLOG) injection 0-5 Units (has no administration in time range)  insulin glargine (LANTUS) injection 10 Units (10 Units Subcutaneous Given 01/14/19 1251)  fentaNYL (SUBLIMAZE) injection 50 mcg (50 mcg Intravenous Given 01/13/19 2047)  lactated ringers bolus 1,000 mL (0 mLs Intravenous Stopped 01/13/19 2343)  propofol (DIPRIVAN) 10 mg/mL bolus/IV push 150 mg ( Intravenous Given 01/13/19 2208)  lactated ringers bolus 1,000 mL (0 mLs Intravenous Stopped 01/13/19 2342)  propofol (DIPRIVAN) 10 mg/mL bolus/IV push (20 mg Intravenous Given 01/13/19 2154)  propofol (DIPRIVAN) 10 mg/mL bolus/IV push (30 mg Intravenous Given 01/13/19 2202)  potassium chloride 10 mEq in 100 mL IVPB (0 mEq Intravenous Stopped 01/14/19 0537)    Mobility walks Low fall risk   Focused Assessments Cardiac Assessment Handoff:  Cardiac Rhythm: Normal sinus rhythm No results found for: CKTOTAL, CKMB, CKMBINDEX, TROPONINI No results found for: DDIMER Does the Patient currently have chest pain? No      R Recommendations: See Admitting Provider Note  Report given to:   Additional Notes:

## 2019-01-14 NOTE — ED Notes (Signed)
EEG called will perform test at bedside now.

## 2019-01-14 NOTE — Procedures (Signed)
Patient Name: Alan Alvarez  MRN: [id number]  Epilepsy Attending: Lora Havens  Referring Physician/Provider: Dr Mitzi Hansen Date: 01/14/2019 Duration: 25.02 mins  Patient history: 37o M with history of seizure presenting after LOC and BL shoulder pain. EEG to evaluate for seizure.   Level of alertness: awake, asleep  AEDs during EEG study: None  Technical aspects: This EEG study was done with scalp electrodes positioned according to the 10-20 International system of electrode placement. Electrical activity was acquired at a sampling rate of 500Hz  and reviewed with a high frequency filter of 70Hz  and a low frequency filter of 1Hz . EEG data were recorded continuously and digitally stored.   DESCRIPTION:  The posterior dominant rhythm consists of 9-10 Hz activity of moderate voltage (25-35 uV) seen predominantly in posterior head regions, symmetric and reactive to eye opening and eye closing. Sleep was characterized by vertex waves. There was an excessive amount of 15 to 18 Hz, 2-3 uV beta activity with irregular morphology distributed symmetrically and diffusely.    Hyperventilation and photic stimulation were not performed.  ABNORMALITY: 1. Excessive fast, generalized  IMPRESSION: This study is within normal limits.  No seizures or epileptiform discharges were seen throughout the recording.  The excessive beta activity seen in the background is most likely due to the effect of benzodiazepine and is a benign EEG pattern. ' [place]

## 2019-01-14 NOTE — Progress Notes (Addendum)
Inpatient Diabetes Program Recommendations  AACE/ADA: New Consensus Statement on Inpatient Glycemic Control (2015)  Target Ranges:  Prepandial:   less than 140 mg/dL      Peak postprandial:   less than 180 mg/dL (1-2 hours)      Critically ill patients:  140 - 180 mg/dL   Lab Results  Component Value Date   GLUCAP 187 (H) 01/14/2019   HGBA1C 10.8 (H) 01/14/2019    Review of Glycemic Control Results for LONDYN, WOTTON (MRN [id number]) as of 01/14/2019 10:09  Ref. Range 01/14/2019 04:16 01/14/2019 05:31 01/14/2019 06:42 01/14/2019 07:46 01/14/2019 08:57  Glucose-Capillary Latest Ref Range: 70 - 99 mg/dL 208 (H) 194 (H) 174 (H) 153 (H) 174 (H)   Diabetes history: DM 2 Outpatient Diabetes medications:  Metformin 1000 mg daily Current orders for Inpatient glycemic control:  Novolog moderate tid with meals and HS Lantus 10 units daily Inpatient Diabetes Program Recommendations:    Note patient admitted with blood sugar>300 mg/dL and mild DKA.  A1C= 10.8%. May need basal insulin at d/c to assist with CBG management/diabetes control.   Will order insulin teaching, LWWD booklet and talk with patient regarding DM management today.  Likely needs resources for medications and PCP as no insurance is listed.   Will follow.    Addendum 1645: Spoke with patient regarding diabetes and A1C.  Discussed A1C results with him and explained goal A1C of 7%. Patient states that he was told he had diabetes and put on medications however never really knew how DM affects the body.  He has a meter and states that his blood sugars are usually 300 or more.  We discussed normal blood glucose values.  Spoke with pt about new diagnosis.  Discussed A1C results with him and explained what an A1C is, basic pathophysiology of DM Type 2, basic home care, importance of checking CBGs and maintaining good CBG control to prevent long-term and short-term complications.  Reviewed signs and symptoms of hyperglycemia and hypoglycemia.   RNs to provide ongoing basic DM education at bedside with this patient.  Have ordered educational booklet and insulin starter kit.    -May need affordable insulin such as 70/30 at d/c if medication assistance not available.  Patient appreciative of information.   Thanks,  Adah Perl, RN, BC-ADM Inpatient Diabetes Coordinator Pager [phone] (8a-5p)

## 2019-01-14 NOTE — ED Notes (Signed)
Spoke to Dr. Myna Hidalgo concerning Anion gap 57, MD to place orders

## 2019-01-14 NOTE — ED Notes (Signed)
Family at bedside. 

## 2019-01-14 NOTE — ED Notes (Signed)
EEG at bedside.

## 2019-01-14 NOTE — Progress Notes (Signed)
Patient trasfered from ED to [phone] via wheelchair; alert and oriented x 4; complaints of pain bilateral shoulders; IV saline locked in LFA and RFA; skin intact. Orient patient to room and unit; gave patient care guide; instructed how to use the call bell and  fall risk precautions. Will continue to monitor the patient.

## 2019-01-14 NOTE — Progress Notes (Signed)
Portable EEG completed, results pending. 

## 2019-01-14 NOTE — Plan of Care (Signed)
  Problem: Clinical Measurements: Goal: Ability to maintain clinical measurements within normal limits will improve Outcome: Progressing   Problem: Clinical Measurements: Goal: Will remain free from infection Outcome: Progressing   

## 2019-01-14 NOTE — Progress Notes (Signed)
PROGRESS NOTE    Calix Heinbaugh  OTL:[id number] DOB: 1980-07-26 DOA: 01/13/2019 PCP: Patient, No Pcp Per    Brief Narrative:   Jp Eastham is a 38 y.o. male with medical history significant for type 2 diabetes mellitus, hypertension, and seizure, presenting to the emergency department after a loss of consciousness and complaining of bilateral shoulder pain.  Patient reports that he is been in his usual state of health, had an uneventful day, was sitting down and eating when he suffered an acute loss of consciousness.  Patient woke up with bilateral shoulder pain.  He felt dizzy just prior to the event.  There were no witnesses.  Patient reports having a seizure approximately 10 years ago, is not sure of the etiology, but states that he has never been on antiepileptics.  There was no associated chest pain or palpitations and the patient denies any recent fevers, chills, cough, or shortness of breath.  He denies any alcohol or illicit substance use.  Upon arrival to the ED, patient is found to be afebrile, saturating well on room air, and with stable blood pressure.  EKG features sinus rhythm with RBBB and LP FB.  Noncontrast head CT is a normal study.  Radiographs of the shoulders demonstrates bilateral posterior dislocations.  Chemistry panel features a glucose of 385 with elevated anion gap and low bicarbonate.  CBC features of leukocytosis 14,500.  Urinalysis notable for glucosuria and ketonuria.  Patient was given 2 L lactated Ringer's, fentanyl, and started on insulin infusion in the emergency department.  Bilateral shoulders were successfully reduced in the ED.  COVID-19 testing is in process.   Assessment & Plan:   Principal Problem:   DKA (diabetic ketoacidoses) (HCC) Active Problems:   Diabetes mellitus without complication (HCC)   History of seizure   Posterior dislocation of left shoulder joint   Posterior dislocation of right shoulder joint   Hypertension   Acute metabolic  encephalopathy   Transient LOC  Patient presenting after an episode of loss of consciousness occurring while seated that was unwitnessed.  He has a history of a seizure 1 year ago, unclear etiology but does not take any antiepileptics.  Patient currently alert and oriented, possible precipitation by DKA.  EKG with NSR, QTC 508, RBB, LP FB.  TTE with EF 70-75%, hyperdynamic LV, no LVH, normal RV function. CT Head without acute findings.  MR brain without acute finding. --EEG completed, pending results --Continue to monitor on telemetry --Seizure precautions --Repeat EKG in a.m.   Mild DKA Type II DM Patient with history of diabetes, only on oral medication that he reports compliance, but does not know the name.  Dose on arrival 385 with an anion gap of 16.  20 ketones on urinalysis. --Hemoglobin A1c 10.8, poorly controlled --Initially started on DKA protocol with insulin drip, now try titrated off --AG16-->12 --Lantus 10 units subcutaneously daily --Insulin sliding scale for further coverage --Will need further titration of his insulin regimen --Diabetic educator following, appreciate assistance --Will need assistance with medications as he has no insurance, and no PCP, social work/case management for assistance --Unsafe discharge at this point as patient will require further titration of his insulin, further diabetic education, and assistance with his medications and PCP --Consistent carbohydrate diet    Hypertension  Patient reports taking antihypertensives but not sure of the name.  30 protein and noted on urinalysis. --Blood pressure 148/91 --We will start lisinopril 10 mg p.o. daily for renal protection --Continue to monitor blood pressures, adjust accordingly  Bilateral shoulder dislocations  --Successfully reduced by EDP  --Continue immobilization, outpatient orthopedic follow-up   Severity of Illness: The appropriate patient status for this patient is INPATIENT. Inpatient  status is judged to be reasonable and necessary in order to provide the required intensity of service to ensure the patient's safety. The patient's presenting symptoms, physical exam findings, and initial radiographic and laboratory data in the context of their chronic comorbidities is felt to place them at high risk for further clinical deterioration. Furthermore, it is not anticipated that the patient will be medically stable for discharge from the hospital within 2 midnights of admission. The following factors support the patient status of inpatient.   " The patient's presenting symptoms include loss of consciousness/syncope, bilateral shoulder pain " The worrisome physical exam findings include dislocated shoulders, confusion " The initial radiographic and laboratory data are worrisome because of anion gap acidosis, elevated glucose, bilateral shoulder dislocations " The chronic co-morbidities include diabetes mellitus, history of seizures.   * I certify that at the point of admission it is my clinical judgment that the patient will require inpatient hospital care spanning beyond 2 midnights from the point of admission due to high intensity of service, high risk for further deterioration and high frequency of surveillance required.*   DVT prophylaxis: Lovenox Code Status: Full code Family Communication: None Disposition Plan: Continue inpatient hospitalization, needs further titration of insulin regimen, newly started on insulin, just titrated off insulin drip this morning.  No PCP, will need case management for assistance with medications and outpatient follow-up.  On able to discharge at this point as it would be unsafe given his presenting symptoms of syncope which is still being investigated, monitor on telemetry, and further need of insulin titration.   Consultants:   None  Procedures:   Reduction of bilateral shoulder dislocations by ED provider on 01/13/2019  Antimicrobials:    None   Subjective: Patient seen and examined at bedside, currently having echocardiogram performed.  States mentation improving, still with some confusion to events leading to hospitalization.  Continues with some mild shoulder pain following reduction in the ED of his dislocations.  States is only taking an oral medicine for his diabetes.  Discussed with patient that his hemoglobin A1c is greater than 10, will now need insulin therapy to maintain adequate control.  No other complaints or concerns at this time.  Denies headache, no visual changes, no chest pain, no palpitations, no abdominal pain, no cough/congestion.  No acute events overnight per nursing staff.  Objective: Vitals:   01/14/19 0740 01/14/19 0902 01/14/19 1015 01/14/19 1252  BP: 128/86 (!) 143/98 (!) 141/96 (!) 139/98  Pulse: 87 85 88 83  Resp: 19 18 19 12   Temp:      TempSrc:      SpO2: 96% 97% 97% 99%  Weight:      Height:       No intake or output data in the 24 hours ending 01/14/19 1616 Filed Weights   01/13/19 1943  Weight: 74.2 kg    Examination:  General exam: Appears calm and comfortable  Respiratory system: Clear to auscultation. Respiratory effort normal. Cardiovascular system: S1 & S2 heard, RRR. No JVD, murmurs, rubs, gallops or clicks. No pedal edema. Gastrointestinal system: Abdomen is nondistended, soft and nontender. No organomegaly or masses felt. Normal bowel sounds heard. Central nervous system: Alert and oriented. No focal neurological deficits. Extremities: Symmetric 5 x 5 power.  Bilateral upper extremities in sling Skin: No rashes, lesions or  ulcers Psychiatry: Judgement and insight appear normal. Mood & affect appropriate.     Data Reviewed: I have personally reviewed following labs and imaging studies  CBC: Recent Labs  Lab 01/13/19 1945 01/13/19 2144  WBC 14.5*  --   HGB 17.3* 17.7*  HCT 48.8 52.0  MCV 87.9  --   PLT 364  --    Basic Metabolic Panel: Recent Labs  Lab  01/13/19 1945 01/13/19 2144 01/14/19 0147 01/14/19 1026  NA 132* 134* 137 137  K 4.0 4.4 3.9 3.5  CL 97*  --  100 101  CO2 19*  --  23 24  GLUCOSE 385*  --  249* 190*  BUN 14  --  17 11  CREATININE 1.01  --  0.99 0.83  CALCIUM 9.8  --  9.7 9.3   GFR: Estimated Creatinine Clearance: 110 mL/min (by C-G formula based on SCr of 0.83 mg/dL). Liver Function Tests: No results for input(s): AST, ALT, ALKPHOS, BILITOT, PROT, ALBUMIN in the last 168 hours. No results for input(s): LIPASE, AMYLASE in the last 168 hours. No results for input(s): AMMONIA in the last 168 hours. Coagulation Profile: No results for input(s): INR, PROTIME in the last 168 hours. Cardiac Enzymes: No results for input(s): CKTOTAL, CKMB, CKMBINDEX, TROPONINI in the last 168 hours. BNP (last 3 results) No results for input(s): PROBNP in the last 8760 hours. HbA1C: Recent Labs    01/14/19 0147  HGBA1C 10.8*   CBG: Recent Labs  Lab 01/14/19 0746 01/14/19 0857 01/14/19 1010 01/14/19 1212 01/14/19 1338  GLUCAP 153* 174* 187* 233* 216*   Lipid Profile: No results for input(s): CHOL, HDL, LDLCALC, TRIG, CHOLHDL, LDLDIRECT in the last 72 hours. Thyroid Function Tests: No results for input(s): TSH, T4TOTAL, FREET4, T3FREE, THYROIDAB in the last 72 hours. Anemia Panel: No results for input(s): VITAMINB12, FOLATE, FERRITIN, TIBC, IRON, RETICCTPCT in the last 72 hours. Sepsis Labs: No results for input(s): PROCALCITON, LATICACIDVEN in the last 168 hours.  Recent Results (from the past 240 hour(s))  SARS Coronavirus 2 [facility] order, Performed in [facility] hospital lab) Nasopharyngeal Nasopharyngeal Swab     Status: None   Collection Time: 01/13/19 10:27 PM   Specimen: Nasopharyngeal Swab  Result Value Ref Range Status   SARS Coronavirus 2 NEGATIVE NEGATIVE Final    Comment: (NOTE) If result is NEGATIVE SARS-CoV-2 target nucleic acids are NOT DETECTED. The SARS-CoV-2 RNA is generally detectable in upper  and lower  respiratory specimens during the acute phase of infection. The lowest  concentration of SARS-CoV-2 viral copies this assay can detect is 250  copies / mL. A negative result does not preclude SARS-CoV-2 infection  and should not be used as the sole basis for treatment or other  patient management decisions.  A negative result may occur with  improper specimen collection / handling, submission of specimen other  than nasopharyngeal swab, presence of viral mutation(s) within the  areas targeted by this assay, and inadequate number of viral copies  (<250 copies / mL). A negative result must be combined with clinical  observations, patient history, and epidemiological information. If result is POSITIVE SARS-CoV-2 target nucleic acids are DETECTED. The SARS-CoV-2 RNA is generally detectable in upper and lower  respiratory specimens dur ing the acute phase of infection.  Positive  results are indicative of active infection with SARS-CoV-2.  Clinical  correlation with patient history and other diagnostic information is  necessary to determine patient infection status.  Positive results do  not rule  out bacterial infection or co-infection with other viruses. If result is PRESUMPTIVE POSTIVE SARS-CoV-2 nucleic acids MAY BE PRESENT.   A presumptive positive result was obtained on the submitted specimen  and confirmed on repeat testing.  While 2019 novel coronavirus  (SARS-CoV-2) nucleic acids may be present in the submitted sample  additional confirmatory testing may be necessary for epidemiological  and / or clinical management purposes  to differentiate between  SARS-CoV-2 and other Sarbecovirus currently known to infect humans.  If clinically indicated additional testing with an alternate test  methodology [phone]) is advised. The SARS-CoV-2 RNA is generally  detectable in upper and lower respiratory sp ecimens during the acute  phase of infection. The expected result is  Negative. Fact Sheet for Patients:  BoilerBrush.com.cy Fact Sheet for Healthcare Providers: https://pope.com/ This test is not yet approved or cleared by the [place] FDA and has been authorized for detection and/or diagnosis of SARS-CoV-2 by FDA under an Emergency Use Authorization (EUA).  This EUA will remain in effect (meaning this test can be used) for the duration of the COVID-19 declaration under Section 564(b)(1) of the Act, 21 U.S.C. section 360bbb-3(b)(1), unless the authorization is terminated or revoked sooner. Performed at [facility], [address], [place], [place] [address]          Radiology Studies: Dg Shoulder Right  Result Date: 01/13/2019 CLINICAL DATA:  Bilateral arm pain following seizure. EXAM: RIGHT SHOULDER - 2+ VIEW COMPARISON:  None. FINDINGS: Findings suspicious for posterior dislocation at the right glenohumeral joint. Appearance of the humeral head suspicious for reverse Hill-Sachs injury. AC joint intact. IMPRESSION: Findings suspicious for posterior right shoulder dislocation with reverse Hill-Sachs injury. Electronically Signed   By: Charlett Nose M.D.   On: 01/13/2019 20:59   Ct Head Wo Contrast  Result Date: 01/13/2019 CLINICAL DATA:  Syncope. EXAM: CT HEAD WITHOUT CONTRAST TECHNIQUE: Contiguous axial images were obtained from the base of the skull through the vertex without intravenous contrast. COMPARISON:  None. FINDINGS: Brain: No evidence of acute infarction, hemorrhage, hydrocephalus, extra-axial collection or mass lesion/mass effect. Vascular: No hyperdense vessel or unexpected calcification. Skull: Normal. Negative for fracture or focal lesion. Sinuses/Orbits: No acute finding. Other: None. IMPRESSION: 1. Normal noncontrast head CT. Electronically Signed   By: Obie Dredge M.D.   On: 01/13/2019 20:31   Mr Brain Wo Contrast  Result Date: 01/14/2019 CLINICAL DATA:  Initial evaluation  for acute altered mental status. EXAM: MRI HEAD WITHOUT CONTRAST TECHNIQUE: Multiplanar, multiecho pulse sequences of the brain and surrounding structures were obtained without intravenous contrast. COMPARISON:  Prior head CT from 01/13/2019. FINDINGS: Brain: Cerebral volume within normal limits for patient age. No focal parenchymal signal abnormality identified. No abnormal foci of restricted diffusion to suggest acute or subacute ischemia. Gray-white matter differentiation well maintained. No encephalomalacia to suggest chronic infarction. No foci of susceptibility artifact to suggest acute or chronic intracranial hemorrhage. No mass lesion, midline shift or mass effect. No hydrocephalus. No extra-axial fluid collection. Major dural sinuses are grossly patent. No intrinsic temporal lobe abnormality. Pituitary gland and suprasellar region are normal. Midline structures intact and normal. Vascular: Major intracranial vascular flow voids well maintained and normal in appearance. Skull and upper cervical spine: Craniocervical junction normal. Visualized upper cervical spine within normal limits. Bone marrow signal intensity normal. No scalp soft tissue abnormality. Sinuses/Orbits: Globes and orbital soft tissues within normal limits. Paranasal sinuses are clear. No mastoid effusion. Inner ear structures normal. Other: None. IMPRESSION: Normal brain MRI.  No acute intracranial abnormality identified. Electronically Signed   By: Rise Mu M.D.   On: 01/14/2019 04:04   Dg Shoulder Left  Result Date: 01/13/2019 CLINICAL DATA:  Seizure, bilateral arm pain EXAM: LEFT SHOULDER - 2+ VIEW COMPARISON:  None. FINDINGS: Posterior left shoulder dislocation seen. Findings concerning for reverse Hill-Sachs deformity. AC joint is intact. IMPRESSION: Posterior dislocation with reverse Hill-Sachs injury. Electronically Signed   By: Charlett Nose M.D.   On: 01/13/2019 21:00   Dg Shoulder Left Portable  Result Date:  01/13/2019 CLINICAL DATA:  Postreduction EXAM: LEFT SHOULDER - 1 VIEW COMPARISON:  01/13/2019 FINDINGS: Interval reduction of the previously seen posterior left shoulder dislocation. Normal alignment. IMPRESSION: Interval reduction. Electronically Signed   By: Charlett Nose M.D.   On: 01/13/2019 22:27   Dg Shoulder Right Port  Result Date: 01/13/2019 CLINICAL DATA:  Postreduction EXAM: PORTABLE RIGHT SHOULDER COMPARISON:  01/13/2019 FINDINGS: Interval reduction of the previously seen posterior right shoulder dislocation. Normal alignment. IMPRESSION: Interval reduction. Electronically Signed   By: Charlett Nose M.D.   On: 01/13/2019 22:28        Scheduled Meds:  enoxaparin (LOVENOX) injection  40 mg Subcutaneous Q24H   insulin aspart  0-15 Units Subcutaneous TID WC   insulin aspart  0-5 Units Subcutaneous QHS   insulin glargine  10 Units Subcutaneous Daily   sodium chloride flush  3 mL Intravenous Once   Continuous Infusions:  dextrose 5 % and 0.45% NaCl Stopped (01/14/19 1024)   insulin Stopped (01/14/19 1005)     LOS: 0 days    Time spent: 38 minutes spent on chart review, discussion with nursing staff, consultants, person reviewing all imaging studies and labs, updating family and interview/physical exam; more than 50% of that time was spent in counseling and/or coordination of care.    Alvira Philips [place], DO [facility] Pager [phone]  If 7PM-7AM, please contact night-coverage [email or web address] Password [facility] 01/14/2019, 4:16 PM

## 2019-01-14 NOTE — ED Notes (Signed)
Echo at bedside

## 2019-01-14 NOTE — Progress Notes (Signed)
  Echocardiogram 2D Echocardiogram has been performed.  Matilde Bash 01/14/2019, 11:08 AM

## 2019-01-14 NOTE — ED Notes (Signed)
Patient transported to MRI 

## 2019-01-14 NOTE — ED Notes (Signed)
Meal tray at bedside.  

## 2019-01-14 NOTE — ED Notes (Signed)
Pt CBG 216

## 2019-01-15 LAB — MAGNESIUM: Magnesium: 2.1 mg/dL (ref 1.7–2.4)

## 2019-01-15 LAB — BASIC METABOLIC PANEL
Anion gap: 12 (ref 5–15)
BUN: 9 mg/dL (ref 6–20)
CO2: 24 mmol/L (ref 22–32)
Calcium: 9.3 mg/dL (ref 8.9–10.3)
Chloride: 96 mmol/L — ABNORMAL LOW (ref 98–111)
Creatinine, Ser: 0.81 mg/dL (ref 0.61–1.24)
GFR calc Af Amer: >60 mL/min (ref >60)
GFR calc non Af Amer: >60 mL/min (ref >60)
Glucose, Bld: 224 mg/dL — ABNORMAL HIGH (ref 70–99)
Potassium: 3.6 mmol/L (ref 3.5–5.1)
Sodium: 132 mmol/L — ABNORMAL LOW (ref 135–145)

## 2019-01-15 LAB — GLUCOSE, CAPILLARY
Glucose-Capillary: 210 mg/dL — ABNORMAL HIGH (ref 70–99)
Glucose-Capillary: 250 mg/dL — ABNORMAL HIGH (ref 70–99)
Glucose-Capillary: 204 mg/dL — ABNORMAL HIGH (ref 70–99)
Glucose-Capillary: 233 mg/dL — ABNORMAL HIGH (ref 70–99)

## 2019-01-15 MED ORDER — POTASSIUM CHLORIDE CRYS ER 20 MEQ PO TBCR
40.0000 meq | EXTENDED_RELEASE_TABLET | Freq: Once | ORAL | Status: AC
  Administered 2019-01-15: 40 meq via ORAL
  Filled 2019-01-15: qty 2

## 2019-01-15 NOTE — Progress Notes (Signed)
PROGRESS NOTE    Alan Alvarez  UXL:[id number] DOB: Sep 01, 1980 DOA: 01/13/2019 PCP: Patient, No Pcp Per    Brief Narrative:   Alan Alvarez is a 38 y.o. male with medical history significant for type 2 diabetes mellitus, hypertension, and seizure, presenting to the emergency department after a loss of consciousness and complaining of bilateral shoulder pain.  Patient reports that he is been in his usual state of health, had an uneventful day, was sitting down and eating when he suffered an acute loss of consciousness.  Patient woke up with bilateral shoulder pain.  He felt dizzy just prior to the event.  There were no witnesses.  Patient reports having a seizure approximately 10 years ago, is not sure of the etiology, but states that he has never been on antiepileptics.  There was no associated chest pain or palpitations and the patient denies any recent fevers, chills, cough, or shortness of breath.  He denies any alcohol or illicit substance use.  Upon arrival to the ED, patient is found to be afebrile, saturating well on room air, and with stable blood pressure.  EKG features sinus rhythm with RBBB and LP FB.  Noncontrast head CT is a normal study.  Radiographs of the shoulders demonstrates bilateral posterior dislocations.  Chemistry panel features a glucose of 385 with elevated anion gap and low bicarbonate.  CBC features of leukocytosis 14,500.  Urinalysis notable for glucosuria and ketonuria.  Patient was given 2 L lactated Ringer's, fentanyl, and started on insulin infusion in the emergency department.  Bilateral shoulders were successfully reduced in the ED.  COVID-19 testing is in process.  01/15/2019: Patient seen.  Available records reviewed.  Input from neurology team is appreciated.  No seizure noted since admission.  EEG is read as normal.  QTc interval today is 500 ms.  Low potassium is noted, will replete.  We will also check magnesium level.  Otherwise, no new complaints.   Assessment  & Plan:   Principal Problem:   DKA (diabetic ketoacidoses) (HCC) Active Problems:   Diabetes mellitus without complication (HCC)   History of seizure   Posterior dislocation of left shoulder joint   Posterior dislocation of right shoulder joint   Hypertension   Acute metabolic encephalopathy   Transient LOC  Patient presenting after an episode of loss of consciousness occurring while seated that was unwitnessed.  He has a history of a seizure 1 year ago, unclear etiology but does not take any antiepileptics.  Patient currently alert and oriented, possible precipitation by DKA.  EKG with NSR, QTC 508, RBB, LP FB.  TTE with EF 70-75%, hyperdynamic LV, no LVH, normal RV function. CT Head without acute findings.  MR brain without acute finding. --EEG completed, pending results --Continue to monitor on telemetry --Seizure precautions --Repeat EKG in a.m. 01/15/2019: See above.  EEG is nonrevealing.   Mild DKA Type II DM Patient with history of diabetes, only on oral medication that he reports compliance, but does not know the name.  Dose on arrival 385 with an anion gap of 16.  20 ketones on urinalysis. --Hemoglobin A1c 10.8, poorly controlled --Initially started on DKA protocol with insulin drip, now try titrated off --AG16-->12 --Lantus 10 units subcutaneously daily --Insulin sliding scale for further coverage --Will need further titration of his insulin regimen --Diabetic educator following, appreciate assistance --Will need assistance with medications as he has no insurance, and no PCP, social work/case management for assistance --Unsafe discharge at this point as patient will require further  titration of his insulin, further diabetic education, and assistance with his medications and PCP --Consistent carbohydrate diet   01/15/2019: Continue to optimize.  Hypertension  Patient reports taking antihypertensives but not sure of the name.  30 protein and noted on urinalysis. --Blood  pressure 148/91 --We will start lisinopril 10 mg p.o. daily for renal protection --Continue to monitor blood pressures, adjust accordingly 01/15/2019: Blood pressures is reasonably controlled.  Bilateral shoulder dislocations  --Successfully reduced by EDP  --Continue immobilization, outpatient orthopedic follow-up   DVT prophylaxis: Lovenox Code Status: Full code Family Communication: None Disposition Plan: Continue inpatient hospitalization, needs further titration of insulin regimen, newly started on insulin, just titrated off insulin drip this morning.  No PCP, will need case management for assistance with medications and outpatient follow-up.  Likely discharge home in the next 1 to 2 days  Consultants:   None  Procedures:   Reduction of bilateral shoulder dislocations by ED provider on 01/13/2019  Antimicrobials:   None   Subjective: No seizure reported No loss of consciousness No constitutional symptoms  Objective: Vitals:   01/14/19 1757 01/14/19 2320 01/15/19 0500 01/15/19 0509  BP:  (!) 158/94  (!) 149/98  Pulse:  86  93  Resp: 16 16 18 16   Temp:  98.4 F (36.9 C)  100 F (37.8 C)  TempSrc:  Oral  Oral  SpO2:  97%  98%  Weight:   78.3 kg   Height:        Intake/Output Summary (Last 24 hours) at 01/15/2019 1135 Last data filed at 01/15/2019 1100 Gross per 24 hour  Intake 360 ml  Output 500 ml  Net -140 ml   Filed Weights   01/13/19 1943 01/15/19 0500  Weight: 74.2 kg 78.3 kg    Examination:  General exam: Appears calm and comfortable  Respiratory system: Clear to auscultation. Respiratory effort normal. Cardiovascular system: S1 & S2 heard Gastrointestinal system: Abdomen is nondistended, soft and nontender. No organomegaly or masses felt. Normal bowel sounds heard. Central nervous system: Alert and oriented. No focal neurological deficits. Extremities: No leg edema  Data Reviewed: I have personally reviewed following labs and imaging  studies  CBC: Recent Labs  Lab 01/13/19 1945 01/13/19 2144  WBC 14.5*  --   HGB 17.3* 17.7*  HCT 48.8 52.0  MCV 87.9  --   PLT 364  --    Basic Metabolic Panel: Recent Labs  Lab 01/13/19 1945 01/13/19 2144 01/14/19 0147 01/14/19 1026 01/15/19 0216  NA 132* 134* 137 137 132*  K 4.0 4.4 3.9 3.5 3.6  CL 97*  --  100 101 96*  CO2 19*  --  23 24 24   GLUCOSE 385*  --  249* 190* 224*  BUN 14  --  17 11 9   CREATININE 1.01  --  0.99 0.83 0.81  CALCIUM 9.8  --  9.7 9.3 9.3   GFR: Estimated Creatinine Clearance: 122.9 mL/min (by C-G formula based on SCr of 0.81 mg/dL). Liver Function Tests: No results for input(s): AST, ALT, ALKPHOS, BILITOT, PROT, ALBUMIN in the last 168 hours. No results for input(s): LIPASE, AMYLASE in the last 168 hours. No results for input(s): AMMONIA in the last 168 hours. Coagulation Profile: No results for input(s): INR, PROTIME in the last 168 hours. Cardiac Enzymes: No results for input(s): CKTOTAL, CKMB, CKMBINDEX, TROPONINI in the last 168 hours. BNP (last 3 results) No results for input(s): PROBNP in the last 8760 hours. HbA1C: Recent Labs    01/14/19 0147  HGBA1C 10.8*   CBG: Recent Labs  Lab 01/14/19 1212 01/14/19 1338 01/14/19 1733 01/14/19 2322 01/15/19 0806  GLUCAP 233* 216* 194* 252* 210*   Lipid Profile: No results for input(s): CHOL, HDL, LDLCALC, TRIG, CHOLHDL, LDLDIRECT in the last 72 hours. Thyroid Function Tests: No results for input(s): TSH, T4TOTAL, FREET4, T3FREE, THYROIDAB in the last 72 hours. Anemia Panel: No results for input(s): VITAMINB12, FOLATE, FERRITIN, TIBC, IRON, RETICCTPCT in the last 72 hours. Sepsis Labs: No results for input(s): PROCALCITON, LATICACIDVEN in the last 168 hours.  Recent Results (from the past 240 hour(s))  SARS Coronavirus 2 [facility](Hospital order, Performed in [facility] hospital lab) Nasopharyngeal Nasopharyngeal Swab     Status: None   Collection Time: 01/13/19 10:27 PM   Specimen:  Nasopharyngeal Swab  Result Value Ref Range Status   SARS Coronavirus 2 NEGATIVE NEGATIVE Final    Comment: (NOTE) If result is NEGATIVE SARS-CoV-2 target nucleic acids are NOT DETECTED. The SARS-CoV-2 RNA is generally detectable in upper and lower  respiratory specimens during the acute phase of infection. The lowest  concentration of SARS-CoV-2 viral copies this assay can detect is 250  copies / mL. A negative result does not preclude SARS-CoV-2 infection  and should not be used as the sole basis for treatment or other  patient management decisions.  A negative result may occur with  improper specimen collection / handling, submission of specimen other  than nasopharyngeal swab, presence of viral mutation(s) within the  areas targeted by this assay, and inadequate number of viral copies  (<250 copies / mL). A negative result must be combined with clinical  observations, patient history, and epidemiological information. If result is POSITIVE SARS-CoV-2 target nucleic acids are DETECTED. The SARS-CoV-2 RNA is generally detectable in upper and lower  respiratory specimens dur ing the acute phase of infection.  Positive  results are indicative of active infection with SARS-CoV-2.  Clinical  correlation with patient history and other diagnostic information is  necessary to determine patient infection status.  Positive results do  not rule out bacterial infection or co-infection with other viruses. If result is PRESUMPTIVE POSTIVE SARS-CoV-2 nucleic acids MAY BE PRESENT.   A presumptive positive result was obtained on the submitted specimen  and confirmed on repeat testing.  While 2019 novel coronavirus  (SARS-CoV-2) nucleic acids may be present in the submitted sample  additional confirmatory testing may be necessary for epidemiological  and / or clinical management purposes  to differentiate between  SARS-CoV-2 and other Sarbecovirus currently known to infect humans.  If clinically  indicated additional testing with an alternate test  methodology [phone]([id number]) is advised. The SARS-CoV-2 RNA is generally  detectable in upper and lower respiratory sp ecimens during the acute  phase of infection. The expected result is Negative. Fact Sheet for Patients:  BoilerBrush.com.cyhttps://www.fda.gov/media/136312/download Fact Sheet for Healthcare Providers: https://pope.com/https://www.fda.gov/media/136313/download This test is not yet approved or cleared by the [place] FDA and has been authorized for detection and/or diagnosis of SARS-CoV-2 by FDA under an Emergency Use Authorization (EUA).  This EUA will remain in effect (meaning this test can be used) for the duration of the COVID-19 declaration under Section 564(b)(1) of the Act, 21 U.S.C. section 360bbb-3(b)(1), unless the authorization is terminated or revoked sooner. Performed at [facility], [address] [place], [place] [address]          Radiology Studies: Dg Shoulder Right  Result Date: 01/13/2019 CLINICAL DATA:  Bilateral arm pain following seizure. EXAM: RIGHT  SHOULDER - 2+ VIEW COMPARISON:  None. FINDINGS: Findings suspicious for posterior dislocation at the right glenohumeral joint. Appearance of the humeral head suspicious for reverse Hill-Sachs injury. AC joint intact. IMPRESSION: Findings suspicious for posterior right shoulder dislocation with reverse Hill-Sachs injury. Electronically Signed   By: Charlett Nose M.D.   On: 01/13/2019 20:59   Ct Head Wo Contrast  Result Date: 01/13/2019 CLINICAL DATA:  Syncope. EXAM: CT HEAD WITHOUT CONTRAST TECHNIQUE: Contiguous axial images were obtained from the base of the skull through the vertex without intravenous contrast. COMPARISON:  None. FINDINGS: Brain: No evidence of acute infarction, hemorrhage, hydrocephalus, extra-axial collection or mass lesion/mass effect. Vascular: No hyperdense vessel or unexpected calcification. Skull: Normal. Negative for fracture or focal lesion.  Sinuses/Orbits: No acute finding. Other: None. IMPRESSION: 1. Normal noncontrast head CT. Electronically Signed   By: Obie Dredge M.D.   On: 01/13/2019 20:31   Mr Brain Wo Contrast  Result Date: 01/14/2019 CLINICAL DATA:  Initial evaluation for acute altered mental status. EXAM: MRI HEAD WITHOUT CONTRAST TECHNIQUE: Multiplanar, multiecho pulse sequences of the brain and surrounding structures were obtained without intravenous contrast. COMPARISON:  Prior head CT from 01/13/2019. FINDINGS: Brain: Cerebral volume within normal limits for patient age. No focal parenchymal signal abnormality identified. No abnormal foci of restricted diffusion to suggest acute or subacute ischemia. Gray-white matter differentiation well maintained. No encephalomalacia to suggest chronic infarction. No foci of susceptibility artifact to suggest acute or chronic intracranial hemorrhage. No mass lesion, midline shift or mass effect. No hydrocephalus. No extra-axial fluid collection. Major dural sinuses are grossly patent. No intrinsic temporal lobe abnormality. Pituitary gland and suprasellar region are normal. Midline structures intact and normal. Vascular: Major intracranial vascular flow voids well maintained and normal in appearance. Skull and upper cervical spine: Craniocervical junction normal. Visualized upper cervical spine within normal limits. Bone marrow signal intensity normal. No scalp soft tissue abnormality. Sinuses/Orbits: Globes and orbital soft tissues within normal limits. Paranasal sinuses are clear. No mastoid effusion. Inner ear structures normal. Other: None. IMPRESSION: Normal brain MRI.  No acute intracranial abnormality identified. Electronically Signed   By: Rise Mu M.D.   On: 01/14/2019 04:04   Dg Shoulder Left  Result Date: 01/13/2019 CLINICAL DATA:  Seizure, bilateral arm pain EXAM: LEFT SHOULDER - 2+ VIEW COMPARISON:  None. FINDINGS: Posterior left shoulder dislocation seen. Findings  concerning for reverse Hill-Sachs deformity. AC joint is intact. IMPRESSION: Posterior dislocation with reverse Hill-Sachs injury. Electronically Signed   By: Charlett Nose M.D.   On: 01/13/2019 21:00   Dg Shoulder Left Portable  Result Date: 01/13/2019 CLINICAL DATA:  Postreduction EXAM: LEFT SHOULDER - 1 VIEW COMPARISON:  01/13/2019 FINDINGS: Interval reduction of the previously seen posterior left shoulder dislocation. Normal alignment. IMPRESSION: Interval reduction. Electronically Signed   By: Charlett Nose M.D.   On: 01/13/2019 22:27   Dg Shoulder Right Port  Result Date: 01/13/2019 CLINICAL DATA:  Postreduction EXAM: PORTABLE RIGHT SHOULDER COMPARISON:  01/13/2019 FINDINGS: Interval reduction of the previously seen posterior right shoulder dislocation. Normal alignment. IMPRESSION: Interval reduction. Electronically Signed   By: Charlett Nose M.D.   On: 01/13/2019 22:28        Scheduled Meds:  enoxaparin (LOVENOX) injection  40 mg Subcutaneous Q24H   insulin aspart  0-15 Units Subcutaneous TID WC   insulin aspart  0-5 Units Subcutaneous QHS   insulin glargine  10 Units Subcutaneous Daily   lisinopril  10 mg Oral Daily   potassium chloride  40  mEq Oral Once   sodium chloride flush  3 mL Intravenous Once   Continuous Infusions:    LOS: 1 day    Bonnell Public, MD [facility]  If 7PM-7AM, please contact night-coverage [email or web address] Password [facility] 01/15/2019, 11:35 AM

## 2019-01-16 LAB — CBC WITH DIFFERENTIAL/PLATELET
Abs Immature Granulocytes: 0.05 10*3/uL (ref 0.00–0.07)
Basophils Absolute: 0 10*3/uL (ref 0.0–0.1)
Basophils Relative: 0 %
Eosinophils Absolute: 0.1 10*3/uL (ref 0.0–0.5)
Eosinophils Relative: 1 %
HCT: 47.9 % (ref 39.0–52.0)
Hemoglobin: 16.3 g/dL (ref 13.0–17.0)
Immature Granulocytes: 1 %
Lymphocytes Relative: 29 %
Lymphs Abs: 3.1 10*3/uL (ref 0.7–4.0)
MCH: 30.2 pg (ref 26.0–34.0)
MCHC: 34 g/dL (ref 30.0–36.0)
MCV: 88.7 fL (ref 80.0–100.0)
Monocytes Absolute: 1.2 10*3/uL — ABNORMAL HIGH (ref 0.1–1.0)
Monocytes Relative: 11 %
Neutro Abs: 6.2 10*3/uL (ref 1.7–7.7)
Neutrophils Relative %: 58 %
Platelets: 305 10*3/uL (ref 150–400)
RBC: 5.4 MIL/uL (ref 4.22–5.81)
RDW: 11.8 % (ref 11.5–15.5)
WBC: 10.7 10*3/uL — ABNORMAL HIGH (ref 4.0–10.5)
nRBC: 0 % (ref 0.0–0.2)

## 2019-01-16 LAB — BASIC METABOLIC PANEL
Anion gap: 12 (ref 5–15)
BUN: 13 mg/dL (ref 6–20)
CO2: 23 mmol/L (ref 22–32)
Calcium: 9.4 mg/dL (ref 8.9–10.3)
Chloride: 98 mmol/L (ref 98–111)
Creatinine, Ser: 0.81 mg/dL (ref 0.61–1.24)
GFR calc Af Amer: >60 mL/min (ref >60)
GFR calc non Af Amer: >60 mL/min (ref >60)
Glucose, Bld: 215 mg/dL — ABNORMAL HIGH (ref 70–99)
Potassium: 3.9 mmol/L (ref 3.5–5.1)
Sodium: 133 mmol/L — ABNORMAL LOW (ref 135–145)

## 2019-01-16 LAB — GLUCOSE, CAPILLARY
Glucose-Capillary: 226 mg/dL — ABNORMAL HIGH (ref 70–99)
Glucose-Capillary: 278 mg/dL — ABNORMAL HIGH (ref 70–99)

## 2019-01-16 MED ORDER — ATORVASTATIN CALCIUM 20 MG PO TABS: 20.0000 mg | ORAL_TABLET | Freq: Every day | ORAL | 11 refills | Status: AC

## 2019-01-16 MED ORDER — LEVETIRACETAM 500 MG PO TABS: 500.0000 mg | ORAL_TABLET | Freq: Two times a day (BID) | ORAL | 0 refills | Status: DC

## 2019-01-16 MED ORDER — ATORVASTATIN CALCIUM 20 MG PO TABS: 20.0000 mg | ORAL_TABLET | Freq: Every day | ORAL | 11 refills | Status: DC

## 2019-01-16 MED ORDER — INSULIN GLARGINE 100 UNIT/ML ~~LOC~~ SOLN: 10.0000 [IU] | Freq: Every day | SUBCUTANEOUS | 11 refills | Status: DC

## 2019-01-16 MED ORDER — OXYCODONE-ACETAMINOPHEN 5-325 MG PO TABS: 1.0000 | ORAL_TABLET | ORAL | 0 refills | Status: DC | PRN

## 2019-01-16 MED ORDER — ASPIRIN EC 81 MG PO TBEC: 81.0000 mg | DELAYED_RELEASE_TABLET | Freq: Every day | ORAL | 2 refills | Status: DC

## 2019-01-16 MED ORDER — LISINOPRIL 10 MG PO TABS: 10.0000 mg | ORAL_TABLET | Freq: Every day | ORAL | 0 refills | Status: AC

## 2019-01-16 MED ORDER — ASPIRIN EC 81 MG PO TBEC: 81.0000 mg | DELAYED_RELEASE_TABLET | Freq: Every day | ORAL | 2 refills | Status: AC

## 2019-01-16 MED ORDER — BLOOD GLUCOSE MONITOR KIT: PACK | 0 refills | Status: DC

## 2019-01-16 MED ORDER — INSULIN GLARGINE 100 UNIT/ML ~~LOC~~ SOLN: 10.0000 [IU] | Freq: Every day | SUBCUTANEOUS | 11 refills | Status: AC

## 2019-01-16 MED ORDER — LISINOPRIL 10 MG PO TABS: 10.0000 mg | ORAL_TABLET | Freq: Every day | ORAL | 0 refills | Status: DC

## 2019-01-16 MED ORDER — METFORMIN HCL 1000 MG PO TABS: 1000.0000 mg | ORAL_TABLET | Freq: Two times a day (BID) | ORAL | 1 refills | Status: AC

## 2019-01-16 MED ORDER — MORPHINE SULFATE (PF) 4 MG/ML IV SOLN
4.0000 mg | Freq: Once | INTRAVENOUS | Status: AC
  Administered 2019-01-16: 4 mg via INTRAVENOUS
  Filled 2019-01-16: qty 1

## 2019-01-16 MED ORDER — BLOOD GLUCOSE MONITOR KIT: PACK | 0 refills | Status: AC

## 2019-01-16 NOTE — Progress Notes (Signed)
Nsg Discharge Note  Doctor said narcotic script had been sent to pharmacy and patient was good to discharge. Patient transported by girlfriend.   Admit Date:  01/13/2019 Discharge date: 01/16/2019   Alan Alvarez to be D/C'd Home per MD order.  AVS completed.  Copy for chart, and copy for patient signed, and dated. Patient/caregiver able to verbalize understanding.  Discharge Medication: Allergies as of 01/16/2019   No Known Allergies     Medication List    STOP taking these medications   ibuprofen 200 MG tablet Commonly known as: ADVIL     TAKE these medications   aspirin EC 81 MG tablet Take 1 tablet (81 mg total) by mouth daily.   atorvastatin 20 MG tablet Commonly known as: Lipitor Take 1 tablet (20 mg total) by mouth daily.   blood glucose meter kit and supplies Kit Dispense based on patient and insurance preference. Use up to four times daily as directed. (FOR ICD-9 250.00, 250.01).   insulin glargine 100 UNIT/ML injection Commonly known as: LANTUS Inject 0.1 mLs (10 Units total) into the skin daily.   levETIRAcetam 500 MG tablet Commonly known as: Keppra Take 1 tablet (500 mg total) by mouth 2 (two) times daily.   lisinopril 10 MG tablet Commonly known as: ZESTRIL Take 1 tablet (10 mg total) by mouth daily. Start taking on: January 17, 2019   metFORMIN 1000 MG tablet Commonly known as: GLUCOPHAGE Take 1 tablet (1,000 mg total) by mouth 2 (two) times daily with a meal. What changed: when to take this   oxyCODONE-acetaminophen 5-325 MG tablet Commonly known as: Percocet Take 1 tablet by mouth every 4 (four) hours as needed for severe pain.       Discharge Assessment: Vitals:   01/16/19 0850 01/16/19 1300  BP:  121/88  Pulse:  90  Resp: 16 20  Temp:  98.7 F (37.1 C)  SpO2:  98%   Skin clean, dry and intact without evidence of skin break down, no evidence of skin tears noted. IV catheter discontinued intact. Site without signs and symptoms of  complications - no redness or edema noted at insertion site, patient denies c/o pain - only slight tenderness at site.  Dressing with slight pressure applied.  D/c Instructions-Education: Discharge instructions given to patient/family with verbalized understanding. D/c education completed with patient/family including follow up instructions, medication list, d/c activities limitations if indicated, with other d/c instructions as indicated by MD - patient able to verbalize understanding, all questions fully answered. Patient instructed to return to ED, call 911, or call MD for any changes in condition.  Patient escorted via [place], and D/C home via private auto.  Erasmo Leventhal, RN 01/16/2019 3:44 PM

## 2019-01-16 NOTE — Discharge Summary (Signed)
Physician Discharge Summary  Patient ID: Alan Alvarez MRN: [id number] DOB/AGE: 1980/06/19 38 y.o.  Admit date: 01/13/2019 Discharge date: 01/16/2019  Admission Diagnoses:  Discharge Diagnoses:  Principal Problem:   DKA (diabetic ketoacidoses) ([place]) Active Problems:   Diabetes mellitus without complication ([place])   History of seizure   Posterior dislocation of left shoulder joint   Posterior dislocation of right shoulder joint   Hypertension   Acute metabolic encephalopathy   Discharged Condition: good  Hospital Course: Patient is a 38 year old Hispanic male with past medical history significant for seizure and diabetes mellitus.  According to the patient, his last seizure activity was greater than 10 years ago.  Patient was on metformin prior to admission, however, compliance remains uncertain.  Patient was admitted following period of unconsciousness.  Patient denied fecal or urinary incontinence, but this is chronic.  There were concerns for likely seizure activity.  Patient was admitted for further assessment and management.  On presentation to the hospital, patient was found to have blood sugar of 385 with mild anion gap and acidosis.  Patient was also noted to have bilateral posterior shoulder dislocation.  Patient was managed for DKA.  Patient be discharged on subcutaneous Lantus insulin and metformin 1000 mg twice daily.  Patient has been advised to check his blood sugar at least 4 times daily, document readings and review with primary care provider on next visit.  Patient has also been advised to contact the primary care provider for blood sugar that is consistently greater than 250 or less than 100.  Patient will follow with orthopedic team on discharge.  Patient underwent an EEG that was normal.  Patient be discharged on Keppra.  Patient will follow-up with neurology on discharge.  Patient's care has been optimized.  Patient is eager to be discharged back home.     Consults:  None  Significant Diagnostic Studies: Labs: HbA1c was 10.8%.,  Blood sugar was 385 with anion gap of 16 and blood CO2 of 19.   -EEG was negative for seizure activity  -CT head without contrast was normal.  Brain without contrast was normal.  -Left shoulder x-ray revealed "Posterior dislocation with reverse Hill-Sachs injury".  -Right shoulder x-ray revealed "Findings suspicious for posterior right shoulder dislocation with reverse Hill-Sachs injury".  -Echocardiogram revealed "Left ventricular ejection fraction, by visual estimation, is 70 to 75%. The left ventricle has hyperdynamic function. Normal left ventricular size. There is no left ventricular hypertrophy.  Global right ventricle has normal systolic function.The right ventricular size is normal. No increase in right ventricular wall thickness".  Discharge Exam: Blood pressure 124/87, pulse 90, temperature 98.7 F (37.1 C), resp. rate 16, height 5' 6" (1.676 m), weight 78.3 kg, SpO2 98 %.  Disposition: Discharge disposition: 01-Home or Self Care   Discharge Instructions    Diet Carb Modified   Complete by: As directed    Increase activity slowly   Complete by: As directed      Allergies as of 01/16/2019   No Known Allergies     Medication List    STOP taking these medications   ibuprofen 200 MG tablet Commonly known as: ADVIL     TAKE these medications   aspirin EC 81 MG tablet Take 1 tablet (81 mg total) by mouth daily.   atorvastatin 20 MG tablet Commonly known as: Lipitor Take 1 tablet (20 mg total) by mouth daily.   blood glucose meter kit and supplies Kit Dispense based on patient and insurance preference. Use up to four  times daily as directed. (FOR ICD-9 250.00, 250.01).   insulin glargine 100 UNIT/ML injection Commonly known as: LANTUS Inject 0.1 mLs (10 Units total) into the skin daily.   levETIRAcetam 500 MG tablet Commonly known as: Keppra Take 1 tablet (500 mg total) by mouth 2 (two) times  daily.   lisinopril 10 MG tablet Commonly known as: ZESTRIL Take 1 tablet (10 mg total) by mouth daily. Start taking on: January 17, 2019   metFORMIN 1000 MG tablet Commonly known as: GLUCOPHAGE Take 1 tablet (1,000 mg total) by mouth 2 (two) times daily with a meal. What changed: when to take this   oxyCODONE-acetaminophen 5-325 MG tablet Commonly known as: Percocet Take 1 tablet by mouth every 4 (four) hours as needed for severe pain.        Signed: Sylvester I Ogbata 01/16/2019, 10:18 AM  

## 2019-01-16 NOTE — Care Management (Signed)
Spoke to patient and girlfriend at bedside. Patient sted that he spoke [place]. Patient provided PCP name that he currently goes to and I added to AVS, they will call Monday to make appointment. Provided and explained [place] letter to them. Meds sent to participating pharmacy. GF at bedside states that she already has glucose meter for him.  No other CM needs at this time.

## 2019-01-25 ENCOUNTER — Emergency Department (HOSPITAL_COMMUNITY)
Admission: EM | Admit: 2019-01-25 | Discharge: 2019-01-25 | Disposition: A | Discharge status: Home / Self Care | Payer: Self-pay | Attending: Emergency Medicine | Admitting: Emergency Medicine

## 2019-01-25 ENCOUNTER — Emergency Department (HOSPITAL_COMMUNITY): Payer: Self-pay

## 2019-01-25 ENCOUNTER — Other Ambulatory Visit: Payer: Self-pay

## 2019-01-25 ENCOUNTER — Encounter (HOSPITAL_COMMUNITY): Payer: Self-pay | Admitting: Emergency Medicine

## 2019-01-25 DIAGNOSIS — M25511 Pain in right shoulder: Secondary | ICD-10-CM | POA: Insufficient documentation

## 2019-01-25 DIAGNOSIS — E1165 Type 2 diabetes mellitus with hyperglycemia: Secondary | ICD-10-CM | POA: Insufficient documentation

## 2019-01-25 DIAGNOSIS — M542 Cervicalgia: Secondary | ICD-10-CM | POA: Insufficient documentation

## 2019-01-25 DIAGNOSIS — Z8669 Personal history of other diseases of the nervous system and sense organs: Secondary | ICD-10-CM | POA: Insufficient documentation

## 2019-01-25 DIAGNOSIS — Z7982 Long term (current) use of aspirin: Secondary | ICD-10-CM | POA: Insufficient documentation

## 2019-01-25 DIAGNOSIS — M25512 Pain in left shoulder: Secondary | ICD-10-CM | POA: Insufficient documentation

## 2019-01-25 DIAGNOSIS — Z79899 Other long term (current) drug therapy: Secondary | ICD-10-CM | POA: Insufficient documentation

## 2019-01-25 DIAGNOSIS — Z794 Long term (current) use of insulin: Secondary | ICD-10-CM | POA: Insufficient documentation

## 2019-01-25 DIAGNOSIS — G8911 Acute pain due to trauma: Secondary | ICD-10-CM | POA: Insufficient documentation

## 2019-01-25 DIAGNOSIS — I10 Essential (primary) hypertension: Secondary | ICD-10-CM | POA: Insufficient documentation

## 2019-01-25 DIAGNOSIS — R739 Hyperglycemia, unspecified: Secondary | ICD-10-CM

## 2019-01-25 LAB — CBG MONITORING, ED
Glucose-Capillary: 206 mg/dL — ABNORMAL HIGH (ref 70–99)
Glucose-Capillary: 213 mg/dL — ABNORMAL HIGH (ref 70–99)

## 2019-01-25 MED ORDER — LORAZEPAM 1 MG PO TABS
1.0000 mg | ORAL_TABLET | Freq: Once | ORAL | Status: AC
  Administered 2019-01-25: 1 mg via ORAL
  Filled 2019-01-25: qty 1

## 2019-01-25 MED ORDER — INSULIN GLARGINE 100 UNIT/ML ~~LOC~~ SOLN
10.0000 [IU] | Freq: Once | SUBCUTANEOUS | Status: AC
  Administered 2019-01-25: 10 [IU] via SUBCUTANEOUS
  Filled 2019-01-25: qty 0.1

## 2019-01-25 MED ORDER — KETOROLAC TROMETHAMINE 30 MG/ML IJ SOLN
30.0000 mg | Freq: Once | INTRAMUSCULAR | Status: AC
  Administered 2019-01-25: 30 mg via INTRAMUSCULAR
  Filled 2019-01-25: qty 1

## 2019-01-25 MED ORDER — CYCLOBENZAPRINE HCL 5 MG PO TABS: 5.0000 mg | ORAL_TABLET | Freq: Two times a day (BID) | ORAL | 0 refills | Status: DC | PRN

## 2019-01-25 NOTE — ED Provider Notes (Signed)
[facility] EMERGENCY DEPARTMENT Provider Note   CSN: [id number] Arrival date & time: 01/25/19  3557     History   Chief Complaint Chief Complaint  Patient presents with   Shoulder Pain    HPI Alan Alvarez is a 37 y.o. male.     HPI   38 year old male presents today with complaints of neck and shoulder pain.  Patient was most recently seen in the emergency room on 924 after suffering likely seizure.  He had bilateral posterior shoulder dislocations after a loss of consciousness episode.  He was found to be in DKA.  He had successful shoulder reduction, was admitted to the hospital where his blood sugar was managed at that time.  He was referred to outpatient orthopedics for follow-up evaluation.  He does note that since being discharged he has had bilateral shoulder pain that has been persistent, he is able to range his shoulders but this is very limited, he denies any distal neurological deficits in his hands.  He notes ongoing cervical neck pain.  He notes that he last checked his blood sugar yesterday was 300, he is taking metformin and insulin but did not take his Lantus this morning.  He notes using the oxycodone that was prescribed at discharge but he notes this causes more anxiety than it does help his pain.  No new injuries.    Past Medical History:  Diagnosis Date   Diabetes mellitus without complication ([place])    History of seizure    Hypertension     Patient Active Problem List   Diagnosis Date Noted   Hypertension 32/20/2542   Acute metabolic encephalopathy 70/62/3762   DKA (diabetic ketoacidoses) ([place]) 01/13/2019   Diabetes mellitus without complication ([facility])    History of seizure    Posterior dislocation of left shoulder joint    Posterior dislocation of right shoulder joint     History reviewed. No pertinent surgical history.      Home Medications    Prior to Admission medications   Medication Sig Start Date End Date  Taking? Authorizing Provider  aspirin EC 81 MG tablet Take 1 tablet (81 mg total) by mouth daily. 01/16/19 01/16/20  Dana Allan I, MD  atorvastatin (LIPITOR) 20 MG tablet Take 1 tablet (20 mg total) by mouth daily. 01/16/19 01/16/20  Bonnell Public, MD  blood glucose meter kit and supplies KIT Dispense based on patient and insurance preference. Use up to four times daily as directed. (FOR ICD-9 250.00, 250.01). 01/16/19   Bonnell Public, MD  cyclobenzaprine (FLEXERIL) 5 MG tablet Take 1 tablet (5 mg total) by mouth 2 (two) times daily as needed for muscle spasms. 01/25/19   Kaymarie Wynn, Dellis Filbert, PA-C  insulin glargine (LANTUS) 100 UNIT/ML injection Inject 0.1 mLs (10 Units total) into the skin daily. 01/16/19   Bonnell Public, MD  levETIRAcetam (KEPPRA) 500 MG tablet Take 1 tablet (500 mg total) by mouth 2 (two) times daily. 01/16/19   Dana Allan I, MD  lisinopril (ZESTRIL) 10 MG tablet Take 1 tablet (10 mg total) by mouth daily. 01/17/19   Bonnell Public, MD  metFORMIN (GLUCOPHAGE) 1000 MG tablet Take 1 tablet (1,000 mg total) by mouth 2 (two) times daily with a meal. 01/16/19   Bonnell Public, MD  oxyCODONE-acetaminophen (PERCOCET) 5-325 MG tablet Take 1 tablet by mouth every 4 (four) hours as needed for severe pain. 01/16/19 01/16/20  Bonnell Public, MD    Family History Family History  Problem  Relation Age of Onset   Diabetes Other    Sudden Cardiac Death Neg Hx     Social History Social History   Tobacco Use   Smoking status: Never Smoker   Smokeless tobacco: Never Used  Substance Use Topics   Alcohol use: Not Currently    Frequency: Never   Drug use: Not Currently     Allergies   Patient has no known allergies.   Review of Systems Review of Systems  All other systems reviewed and are negative.    Physical Exam Updated Vital Signs BP 121/85 (BP Location: Right Arm)    Pulse 78    Temp 98 F (36.7 C) (Oral)    Resp 16    SpO2 100%    Physical Exam Vitals signs and nursing note reviewed.  Constitutional:      Appearance: He is well-developed.  HENT:     Head: Normocephalic and atraumatic.  Eyes:     General: No scleral icterus.       Right eye: No discharge.        Left eye: No discharge.     Conjunctiva/sclera: Conjunctivae normal.     Pupils: Pupils are equal, round, and reactive to light.  Neck:     Musculoskeletal: Normal range of motion.     Vascular: No JVD.     Trachea: No tracheal deviation.  Pulmonary:     Effort: Pulmonary effort is normal.     Breath sounds: No stridor.  Musculoskeletal:     Comments: Minimal C-spine tenderness around C3-C4, tenderness to palpation of the bilateral shoulders most notably on the anterior lateral aspect, no swelling or edema, reduced range of motion at the shoulder secondary to discomfort-grip strength 5 out of 5 radial pulse 2+ bilateral sensation intact to the bilateral upper extremities  Neurological:     Mental Status: He is alert and oriented to person, place, and time.     Coordination: Coordination normal.  Psychiatric:        Behavior: Behavior normal.        Thought Content: Thought content normal.        Judgment: Judgment normal.      ED Treatments / Results  Labs (all labs ordered are listed, but only abnormal results are displayed) Labs Reviewed  CBG MONITORING, ED - Abnormal; Notable for the following components:      Result Value   Glucose-Capillary 206 (*)    All other components within normal limits  CBG MONITORING, ED - Abnormal; Notable for the following components:   Glucose-Capillary 213 (*)    All other components within normal limits    EKG None  Radiology Dg Shoulder Right  Result Date: 01/25/2019 CLINICAL DATA:  Recent bilateral shoulder dislocations. Persistent pain, right greater than left. EXAM: RIGHT SHOULDER - 2+ VIEW COMPARISON:  Right shoulder radiographs 01/13/2019 FINDINGS: Shoulder is located. Humeral head fracture  is less well seen on these views. Visualized hemithorax is clear. Visualized clavicle is within normal limits. IMPRESSION: 1. The shoulder is located. 2. No acute abnormality. Electronically Signed   By: San Morelle M.D.   On: 01/25/2019 07:41   Ct Cervical Spine Wo Contrast  Result Date: 01/25/2019 CLINICAL DATA:  Pt fell last week; dislocation to both shoulders. Dislocations were reduced, but pt c/o persistent, significant pain to RIGHT shoulder,neck EXAM: CT CERVICAL SPINE WITHOUT CONTRAST TECHNIQUE: Multidetector CT imaging of the cervical spine was performed without intravenous contrast. Multiplanar CT image reconstructions were also generated. COMPARISON:  None. FINDINGS: Alignment: Mild reversal of the normal cervical lordosis, apex at C4, which could be positional. No spondylolisthesis/subluxation. Skull base and vertebrae: No acute fracture. No primary bone lesion or focal pathologic process. Soft tissues and spinal canal: No prevertebral fluid or swelling. No visible canal hematoma. Disc levels: Discs are well maintained in height. No disc bulging or evidence of a disc herniation. Central spinal canal and neural foramina are well preserved. Upper chest: Negative. Other: None. IMPRESSION: 1. No fracture or acute finding. 2. Mild reversal of the normal cervical lordosis. No other abnormality. Electronically Signed   By: Lajean Manes M.D.   On: 01/25/2019 11:45   Dg Shoulder Left  Result Date: 01/25/2019 CLINICAL DATA:  Recent bilateral shoulder dislocations. Persistent pain. EXAM: LEFT SHOULDER - 2+ VIEW COMPARISON:  Left shoulder radiographs 01/13/2019 FINDINGS: Left shoulder is located. Known humerus fracture is less well seen on these views. Clavicle is intact. The visualized hemithorax is clear. IMPRESSION: 1. No dislocation. 2. No acute abnormality. Electronically Signed   By: San Morelle M.D.   On: 01/25/2019 07:43    Procedures Procedures (including critical care  time)  Medications Ordered in ED Medications  ketorolac (TORADOL) 30 MG/ML injection 30 mg (30 mg Intramuscular Given 01/25/19 1040)  LORazepam (ATIVAN) tablet 1 mg (1 mg Oral Given 01/25/19 1037)  insulin glargine (LANTUS) injection 10 Units (10 Units Subcutaneous Given 01/25/19 1209)     Initial Impression / Assessment and Plan / ED Course  I have reviewed the triage vital signs and the nursing notes.  Pertinent labs & imaging results that were available during my care of the patient were reviewed by me and considered in my medical decision making (see chart for details).        38 year old male presents today with bilateral shoulder pain.  This is likely secondary to recent posterior dislocations.  He has no neurological deficits no new injuries or no recurring dislocations.  His x-rays are reassuring today.  He does have midline cervical tenderness on exam today, will proceed with CT noncontrast imaging to rule out any acute fracture.  Patient is diabetic and did not check his blood sugar today, most recent blood sugar was 300 we will recheck his blood sugar today and give his daily Lantus.  Final Clinical Impressions(s) / ED Diagnoses   Final diagnoses:  Acute pain of both shoulders  Hyperglycemia    ED Discharge Orders         Ordered    cyclobenzaprine (FLEXERIL) 5 MG tablet  2 times daily PRN     01/25/19 1304           Okey Regal, PA-C 01/25/19 1403    Sherwood Gambler, MD 01/26/19 [phone]

## 2019-01-25 NOTE — Discharge Instructions (Signed)
Please read attached information. If you experience any new or worsening signs or symptoms please return to the emergency room for evaluation. Please follow-up with your primary care provider or specialist as discussed. Please use medication prescribed only as directed and discontinue taking if you have any concerning signs or symptoms.   °

## 2019-01-25 NOTE — ED Notes (Signed)
Patient transported to CT 

## 2019-01-25 NOTE — ED Triage Notes (Signed)
Patient states that he had bilateral shoulder dislocation last week, now having more pain in right shoulder.  Continues to have pain in left shoulder also, more on right.

## 2019-01-25 NOTE — ED Notes (Signed)
CBG 213 prior to administering insulin.

## 2019-02-12 ENCOUNTER — Emergency Department (HOSPITAL_COMMUNITY): Payer: Self-pay

## 2019-02-12 ENCOUNTER — Other Ambulatory Visit: Payer: Self-pay

## 2019-02-12 ENCOUNTER — Encounter (HOSPITAL_COMMUNITY): Payer: Self-pay | Admitting: Emergency Medicine

## 2019-02-12 ENCOUNTER — Emergency Department (HOSPITAL_COMMUNITY)
Admission: EM | Admit: 2019-02-12 | Discharge: 2019-02-13 | Disposition: A | Discharge status: Home / Self Care | Payer: Self-pay | Attending: Emergency Medicine | Admitting: Emergency Medicine

## 2019-02-12 DIAGNOSIS — Z7982 Long term (current) use of aspirin: Secondary | ICD-10-CM | POA: Insufficient documentation

## 2019-02-12 DIAGNOSIS — R569 Unspecified convulsions: Secondary | ICD-10-CM | POA: Insufficient documentation

## 2019-02-12 DIAGNOSIS — Y999 Unspecified external cause status: Secondary | ICD-10-CM | POA: Insufficient documentation

## 2019-02-12 DIAGNOSIS — Z794 Long term (current) use of insulin: Secondary | ICD-10-CM | POA: Insufficient documentation

## 2019-02-12 DIAGNOSIS — W182XXA Fall in (into) shower or empty bathtub, initial encounter: Secondary | ICD-10-CM | POA: Insufficient documentation

## 2019-02-12 DIAGNOSIS — S43024A Posterior dislocation of right humerus, initial encounter: Secondary | ICD-10-CM | POA: Insufficient documentation

## 2019-02-12 DIAGNOSIS — Y93E1 Activity, personal bathing and showering: Secondary | ICD-10-CM | POA: Insufficient documentation

## 2019-02-12 DIAGNOSIS — Y92002 Bathroom of unspecified non-institutional (private) residence single-family (private) house as the place of occurrence of the external cause: Secondary | ICD-10-CM | POA: Insufficient documentation

## 2019-02-12 DIAGNOSIS — S43024D Posterior dislocation of right humerus, subsequent encounter: Secondary | ICD-10-CM

## 2019-02-12 DIAGNOSIS — Z9114 Patient's other noncompliance with medication regimen: Secondary | ICD-10-CM | POA: Insufficient documentation

## 2019-02-12 DIAGNOSIS — E119 Type 2 diabetes mellitus without complications: Secondary | ICD-10-CM | POA: Insufficient documentation

## 2019-02-12 DIAGNOSIS — I10 Essential (primary) hypertension: Secondary | ICD-10-CM | POA: Insufficient documentation

## 2019-02-12 LAB — COMPREHENSIVE METABOLIC PANEL
ALT: 27 U/L (ref 0–44)
AST: 25 U/L (ref 15–41)
Albumin: 4.9 g/dL (ref 3.5–5.0)
Alkaline Phosphatase: 78 U/L (ref 38–126)
Anion gap: 15 (ref 5–15)
BUN: 17 mg/dL (ref 6–20)
CO2: 22 mmol/L (ref 22–32)
Calcium: 9.8 mg/dL (ref 8.9–10.3)
Chloride: 98 mmol/L (ref 98–111)
Creatinine, Ser: 1 mg/dL (ref 0.61–1.24)
GFR calc Af Amer: >60 mL/min (ref >60)
GFR calc non Af Amer: >60 mL/min (ref >60)
Glucose, Bld: 207 mg/dL — ABNORMAL HIGH (ref 70–99)
Potassium: 4.2 mmol/L (ref 3.5–5.1)
Sodium: 135 mmol/L (ref 135–145)
Total Bilirubin: 0.8 mg/dL (ref 0.3–1.2)
Total Protein: 8 g/dL (ref 6.5–8.1)

## 2019-02-12 LAB — CBC WITH DIFFERENTIAL/PLATELET
Abs Immature Granulocytes: 0.05 10*3/uL (ref 0.00–0.07)
Basophils Absolute: 0 10*3/uL (ref 0.0–0.1)
Basophils Relative: 0 %
Eosinophils Absolute: 0.2 10*3/uL (ref 0.0–0.5)
Eosinophils Relative: 2 %
HCT: 44.4 % (ref 39.0–52.0)
Hemoglobin: 15.3 g/dL (ref 13.0–17.0)
Immature Granulocytes: 0 %
Lymphocytes Relative: 42 %
Lymphs Abs: 6.1 10*3/uL — ABNORMAL HIGH (ref 0.7–4.0)
MCH: 30.1 pg (ref 26.0–34.0)
MCHC: 34.5 g/dL (ref 30.0–36.0)
MCV: 87.2 fL (ref 80.0–100.0)
Monocytes Absolute: 0.8 10*3/uL (ref 0.1–1.0)
Monocytes Relative: 6 %
Neutro Abs: 7.4 10*3/uL (ref 1.7–7.7)
Neutrophils Relative %: 50 %
Platelets: 371 10*3/uL (ref 150–400)
RBC: 5.09 MIL/uL (ref 4.22–5.81)
RDW: 11.7 % (ref 11.5–15.5)
WBC: 14.7 10*3/uL — ABNORMAL HIGH (ref 4.0–10.5)
nRBC: 0 % (ref 0.0–0.2)

## 2019-02-12 LAB — CBG MONITORING, ED: Glucose-Capillary: 184 mg/dL — ABNORMAL HIGH (ref 70–99)

## 2019-02-12 LAB — ETHANOL: Alcohol, Ethyl (B): <10 mg/dL (ref <10)

## 2019-02-12 MED ORDER — SODIUM CHLORIDE 0.9 % IV BOLUS
1000.0000 mL | Freq: Once | INTRAVENOUS | Status: AC
  Administered 2019-02-12: 21:00:00 1000 mL via INTRAVENOUS

## 2019-02-12 MED ORDER — DIVALPROEX SODIUM 250 MG PO DR TAB
250.0000 mg | DELAYED_RELEASE_TABLET | Freq: Once | ORAL | Status: AC
  Administered 2019-02-12: 250 mg via ORAL
  Filled 2019-02-12: qty 1

## 2019-02-12 MED ORDER — MIDAZOLAM HCL (PF) 5 MG/ML IJ SOLN: 4.0000 mg | Freq: Once | INTRAMUSCULAR | Status: DC

## 2019-02-12 MED ORDER — FENTANYL CITRATE (PF) 100 MCG/2ML IJ SOLN
50.0000 ug | Freq: Once | INTRAMUSCULAR | Status: AC
  Administered 2019-02-12: 50 ug via INTRAVENOUS
  Filled 2019-02-12: qty 2

## 2019-02-12 MED ORDER — PROPOFOL 10 MG/ML IV BOLUS
0.5000 mg/kg | Freq: Once | INTRAVENOUS | Status: AC
  Administered 2019-02-12: 23:00:00 50 mg via INTRAVENOUS

## 2019-02-12 MED ORDER — HYDROMORPHONE HCL 1 MG/ML IJ SOLN
1.0000 mg | Freq: Once | INTRAMUSCULAR | Status: AC
  Administered 2019-02-12: 23:00:00 1 mg via INTRAVENOUS
  Filled 2019-02-12: qty 1

## 2019-02-12 MED ORDER — DIVALPROEX SODIUM 250 MG PO DR TAB: 250.0000 mg | DELAYED_RELEASE_TABLET | Freq: Three times a day (TID) | ORAL | 0 refills | Status: DC

## 2019-02-12 MED ORDER — PROPOFOL 1000 MG/100ML IV EMUL
INTRAVENOUS | Status: AC
  Filled 2019-02-12: qty 100

## 2019-02-12 NOTE — ED Provider Notes (Signed)
[facility] DEPT Provider Note   CSN: [id number] Arrival date & time: 02/12/19  2019     History   Chief Complaint Chief Complaint  Patient presents with  . Shoulder Injury    fall in shower related to seziure   . Seizures    HPI Alan Alvarez is a 37 y.o. male.     HPI Patient presents with concern of a seizure and shoulder pain. Patient notes that he has a history of both, and indeed had persistent right shoulder pain, described as soreness this evening, when he felt his typical prodrome, and subsequently had a witnessed seizure. Per EMS the patient fell while in the shower, but the patient is unclear on whether this was accurate or not. He does state that he was in his usual state of health, though with shoulder pain, right-sided, sore, severe, prior to the episode. He notes that he stopped taking his antiepileptic medicine about 4 days ago due to side effects. No recent seizure activity. Now, patient complains of a sore, severe, nonradiating anterior lateral right shoulder pain. No head pain, no neck pain, no other pain or complaints.  Past Medical History:  Diagnosis Date  . Diabetes mellitus without complication ([place])   . History of seizure   . Hypertension     Patient Active Problem List   Diagnosis Date Noted  . Hypertension 01/14/2019  . Acute metabolic encephalopathy 73/71/0626  . DKA (diabetic ketoacidoses) ([place]) 01/13/2019  . Diabetes mellitus without complication ([place])   . History of seizure   . Posterior dislocation of left shoulder joint   . Posterior dislocation of right shoulder joint     History reviewed. No pertinent surgical history.      Home Medications    Prior to Admission medications   Medication Sig Start Date End Date Taking? Authorizing Provider  aspirin EC 81 MG tablet Take 1 tablet (81 mg total) by mouth daily. 01/16/19 01/16/20  Dana Allan I, MD  atorvastatin (LIPITOR) 20 MG tablet Take 1  tablet (20 mg total) by mouth daily. 01/16/19 01/16/20  Bonnell Public, MD  blood glucose meter kit and supplies KIT Dispense based on patient and insurance preference. Use up to four times daily as directed. (FOR ICD-9 250.00, 250.01). 01/16/19   Bonnell Public, MD  cyclobenzaprine (FLEXERIL) 5 MG tablet Take 1 tablet (5 mg total) by mouth 2 (two) times daily as needed for muscle spasms. 01/25/19   Hedges, Dellis Filbert, PA-C  insulin glargine (LANTUS) 100 UNIT/ML injection Inject 0.1 mLs (10 Units total) into the skin daily. 01/16/19   Bonnell Public, MD  levETIRAcetam (KEPPRA) 500 MG tablet Take 1 tablet (500 mg total) by mouth 2 (two) times daily. 01/16/19   Dana Allan I, MD  lisinopril (ZESTRIL) 10 MG tablet Take 1 tablet (10 mg total) by mouth daily. 01/17/19   Bonnell Public, MD  metFORMIN (GLUCOPHAGE) 1000 MG tablet Take 1 tablet (1,000 mg total) by mouth 2 (two) times daily with a meal. 01/16/19   Bonnell Public, MD  oxyCODONE-acetaminophen (PERCOCET) 5-325 MG tablet Take 1 tablet by mouth every 4 (four) hours as needed for severe pain. 01/16/19 01/16/20  Bonnell Public, MD    Family History Family History  Problem Relation Age of Onset  . Diabetes Other   . Sudden Cardiac Death Neg Hx     Social History Social History   Tobacco Use  . Smoking status: Never Smoker  . Smokeless tobacco: Never  Used  Substance Use Topics  . Alcohol use: Not Currently    Frequency: Never  . Drug use: Not Currently     Allergies   Patient has no known allergies.   Review of Systems Review of Systems  Constitutional:       Per HPI, otherwise negative  HENT:       Per HPI, otherwise negative  Respiratory:       Per HPI, otherwise negative  Cardiovascular:       Per HPI, otherwise negative  Gastrointestinal: Negative for vomiting.  Endocrine:       Negative aside from HPI  Genitourinary:       Neg aside from HPI   Musculoskeletal:       Per HPI, otherwise  negative  Skin: Negative.   Neurological: Positive for seizures. Negative for syncope.     Physical Exam Updated Vital Signs BP (!) 116/92 (BP Location: Left Arm)   Pulse (!) 119   Temp 98.3 F (36.8 C) (Oral)   Resp 20   SpO2 96%   Physical Exam Vitals signs and nursing note reviewed.  Constitutional:      General: He is not in acute distress.    Appearance: He is well-developed.  HENT:     Head: Normocephalic and atraumatic.  Eyes:     Conjunctiva/sclera: Conjunctivae normal.  Cardiovascular:     Rate and Rhythm: Normal rate and regular rhythm.  Pulmonary:     Effort: Pulmonary effort is normal. No respiratory distress.     Breath sounds: No stridor.  Abdominal:     General: There is no distension.  Musculoskeletal:     Left shoulder: Normal.     Right elbow: Normal.      Arms:  Skin:    General: Skin is warm and dry.  Neurological:     Mental Status: He is alert and oriented to person, place, and time.      ED Treatments / Results  Labs (all labs ordered are listed, but only abnormal results are displayed) Labs Reviewed  CBG MONITORING, ED - Abnormal; Notable for the following components:      Result Value   Glucose-Capillary 184 (*)    All other components within normal limits  COMPREHENSIVE METABOLIC PANEL  ETHANOL  CBC WITH DIFFERENTIAL/PLATELET    EKG EKG Interpretation  Date/Time:  Saturday February 12 2019 20:40:10 EDT Ventricular Rate:  114 PR Interval:    QRS Duration: 132 QT Interval:  356 QTC Calculation: 491 R Axis:   92 Text Interpretation:  Sinus tachycardia RBBB and LPFB No significant change since last tracing Abnormal ECG Confirmed by Carmin Muskrat [phone]) on 02/12/2019 8:44:11 PM   Radiology No results found.  Procedures .Sedation  Date/Time: 02/12/2019 11:00 PM Performed by: Carmin Muskrat, MD Authorized by: Carmin Muskrat, MD   Consent:    Consent obtained:  Verbal   Consent given by:  Patient and spouse    Risks discussed:  Allergic reaction, dysrhythmia, inadequate sedation, nausea, prolonged hypoxia resulting in organ damage, prolonged sedation necessitating reversal, respiratory compromise necessitating ventilatory assistance and intubation and vomiting   Alternatives discussed:  Analgesia without sedation, anxiolysis and regional anesthesia Universal protocol:    Procedure explained and questions answered to patient or proxy's satisfaction: yes     Relevant documents present and verified: yes     Test results available and properly labeled: yes     Imaging studies available: yes     Required blood products, implants, devices,  and special equipment available: yes     Site/side marked: yes     Immediately prior to procedure a time out was called: yes     Patient identity confirmation method:  Verbally with patient Indications:    Procedure necessitating sedation performed by:  Physician performing sedation Pre-sedation assessment:    Time since last food or drink:  3   ASA classification: class 2 - patient with mild systemic disease     Neck mobility: normal     Mouth opening:  3 or more finger widths   Thyromental distance:  4 finger widths   Mallampati score:  I - soft palate, uvula, fauces, pillars visible   Pre-sedation assessments completed and reviewed: airway patency, cardiovascular function, hydration status, mental status, nausea/vomiting, pain level, respiratory function and temperature     Pre-sedation assessment completed:  02/12/2019 10:00 PM Immediate pre-procedure details:    Reassessment: Patient reassessed immediately prior to procedure     Reviewed: vital signs, relevant labs/tests and NPO status     Verified: bag valve mask available, emergency equipment available, intubation equipment available, IV patency confirmed, oxygen available and suction available   Procedure details (see MAR for exact dosages):    Preoxygenation:  Nasal cannula   Sedation:  Propofol    Intended level of sedation: deep   Intra-procedure monitoring:  Blood pressure monitoring, cardiac monitor, continuous pulse oximetry, frequent LOC assessments, frequent vital sign checks and continuous capnometry   Intra-procedure events: none     Total Provider sedation time (minutes):  20 Post-procedure details:    Post-sedation assessment completed:  02/12/2019 11:10 PM   Attendance: Constant attendance by certified staff until patient recovered     Recovery: Patient returned to pre-procedure baseline     Post-sedation assessments completed and reviewed: airway patency, cardiovascular function, hydration status, mental status, nausea/vomiting, pain level, respiratory function and temperature     Patient is stable for discharge or admission: yes     Patient tolerance:  Tolerated well, no immediate complications .Ortho Injury Treatment  Date/Time: 02/12/2019 11:01 PM Performed by: Carmin Muskrat, MD Authorized by: Carmin Muskrat, MD   Consent:    Consent obtained:  Verbal   Consent given by:  Patient and spouse   Risks discussed:  Irreducible dislocation and fracture   Alternatives discussed:  Delayed treatment Universal protocol:    Procedure explained and questions answered to patient or proxy's satisfaction: yes     Relevant documents present and verified: yes     Test results available and properly labeled: yes     Imaging studies available: yes     Required blood products, implants, devices, and special equipment available: yes     Site/side marked: yes     Immediately prior to procedure a time out was called: yes     Patient identity confirmed:  Verbally with patientInjury location: shoulder Location details: right shoulder Injury type: dislocation Dislocation type: posterior Hill-Sachs deformity: yes Chronicity: new Pre-procedure neurovascular assessment: neurovascularly intact Pre-procedure distal perfusion: normal Pre-procedure neurological function: normal  Pre-procedure range of motion: reduced  Anesthesia: Local anesthesia used: no  Patient sedated: Yes. Refer to sedation procedure documentation for details of sedation. Manipulation performed: yes Reduction method: external rotation, scapular manipulation and traction and counter traction Reduction successful: yes Immobilization: sling Post-procedure neurovascular assessment: post-procedure neurovascularly intact Post-procedure distal perfusion: normal Post-procedure neurological function: normal Post-procedure range of motion: improved Patient tolerance: patient tolerated the procedure well with no immediate complications Comments: There was substantial improvement following  reduction.    (including critical care time)  Medications Ordered in ED Medications  fentaNYL (SUBLIMAZE) injection 50 mcg (has no administration in time range)  sodium chloride 0.9 % bolus 1,000 mL (has no administration in time range)  divalproex (DEPAKOTE) DR tablet 250 mg (has no administration in time range)     Initial Impression / Assessment and Plan / ED Course  I have reviewed the triage vital signs and the nursing notes.  Pertinent labs & imaging results that were available during my care of the patient were reviewed by me and considered in my medical decision making (see chart for details).    I reviewed the patient's chart following my initial evaluation. This is notable for evaluation within the past month for shoulder pain, post reduction following dislocation.  Patient also had episode of DKA within the past 3 months. Per review, the patient has been on Keppra, and the patient self is unaware of what this medication, or his preceding antiepileptics were. With concern for new seizure activity, and the patient's intolerance of Keppra, he will receive Depakote, and additional analgesia, pending labs, x-ray.   On repeat exam the patient's shoulder continues to have pain, and x-rays consistent  with fracture, Hill-Sachs, as well as dislocation, posterior. I discussed this with patient and his wife, who is now accompanies him. We discussed the importance of reduction, and orthopedic follow-up. As discussed findings as far which are reassuring Given the patient's seizure activity after several days without medication, there is suspicion for necessity of this medication, and will be restarted with a new med, which he will follow up with physician in terms of whether he tolerates better or not.  11:03 PM Patient tolerated conscious sedation, shoulder reduction without complication. X-ray pending, patient will require time to awaken, but if he remains generally well, no additional seizure activity or other complications, may be appropriate for discharge. PA Upstill is aware the patient, will disposition appropriately.   (NEXT DAY) - XR demonstrated persistent posterior dislocation, though he had substantial improvement clinically.  After discussion w ortho and CT, patient d/c w office f/u.  Final Clinical Impressions(s) / ED Diagnoses   Final diagnoses:  Seizure ([place])  Dislocation of right shoulder joint, initial encounter    ED Discharge Orders         Ordered    divalproex (DEPAKOTE) 250 MG DR tablet  3 times daily     02/12/19 2233           Carmin Muskrat, MD 02/13/19 [phone]

## 2019-02-12 NOTE — Discharge Instructions (Addendum)
As discussed, with recurrent seizure episode it is very important that you follow-up with neurology for further management.  Your shoulder injury will need further treatment as well. Please call [facility] and request an appointment with Dr. Stann Mainland.  As well, having a primary care physician is recommended for general medical issues.  You have been given resources for all of the above. Please call tomorrow for the next available appointments. In the interim, please take your newly prescribed seizure medication, and monitor your condition carefully. You have also been given a prescription for pain medications.  No driving or operating machinery until you have been seen by neurology and cleared to return to these activities.   Return here for concerning changes in your condition.

## 2019-02-12 NOTE — ED Provider Notes (Signed)
Here with shoulder subluxation after seizure. Reduced by Dr. Vanita Panda. Seizure work up and treatment have been completed.   Plan: Review Post reduction film and post-sedation observation until ambulatory.  12:50 - Post reduction film shows persistent posterior dislocation with reverse Hill-Sachs deformity. Patient is awake from sedation, oriented. O2 removed to evaluate O2 saturations. Consider ortho consult.     2:00 - He maintains O2 saturation. Continues to improve after sedation. Discussed dislocation with Dr. Wynelle Link who advises get a CT of the shoulder. If it appears acute, he will take the patient to the OR tonight. If it appears subacute, office follow up is recommended as appropriate. Patient and wife updated. Additional pain medication provided.   3:30 - Dr. Wynelle Link has reviewed the CT of the right shoulder. He reports the patient is stable for discharge home. He also notes acute fracture of the glenoid. Patient and wife updated. He has an immobilizer. Percocet called to pharmacy. Appropriate referrals provided including PCP, ortho and neurology.    Charlann Lange, PA-C 02/13/19 0544    Carmin Muskrat, MD 02/13/19 [phone]

## 2019-02-12 NOTE — ED Notes (Signed)
Provider verbal for 50 mg

## 2019-02-12 NOTE — ED Triage Notes (Signed)
  Comes to ed from home, fall/ seizure in shower, fall right shoulder. Pt has 20 in left forearm 100 mcg fenyle, pt v/s 132/95, hr 130, spo2 97, cbg 137, alert x 4 pain, gcf 15.  Pt is sweaty, and tachy.  Family witnessed seizure and on the way.  Pt has a hx of seizures not taking meds for few days, said he did not like the way he made him feel. Pain 8 out 10 currently.

## 2019-02-13 ENCOUNTER — Other Ambulatory Visit: Payer: Self-pay

## 2019-02-13 ENCOUNTER — Emergency Department (HOSPITAL_COMMUNITY): Payer: Self-pay

## 2019-02-13 MED ORDER — OXYCODONE-ACETAMINOPHEN 5-325 MG PO TABS: 1.0000 | ORAL_TABLET | ORAL | 0 refills | Status: DC | PRN

## 2019-02-13 MED ORDER — OXYCODONE-ACETAMINOPHEN 5-325 MG PO TABS
1.0000 | ORAL_TABLET | Freq: Once | ORAL | Status: AC
  Administered 2019-02-13: 1 via ORAL
  Filled 2019-02-13: qty 1

## 2019-02-13 MED ORDER — HYDROMORPHONE HCL 1 MG/ML IJ SOLN
0.5000 mg | Freq: Once | INTRAMUSCULAR | Status: AC
  Administered 2019-02-13: 02:00:00 0.5 mg via INTRAVENOUS
  Filled 2019-02-13: qty 1

## 2019-02-14 ENCOUNTER — Encounter: Payer: Self-pay | Admitting: Neurology

## 2019-02-28 ENCOUNTER — Emergency Department (HOSPITAL_COMMUNITY)
Admission: EM | Admit: 2019-02-28 | Discharge: 2019-03-01 | Disposition: A | Discharge status: Home / Self Care | Payer: Self-pay | Attending: Emergency Medicine | Admitting: Emergency Medicine

## 2019-02-28 ENCOUNTER — Encounter (HOSPITAL_COMMUNITY): Payer: Self-pay | Admitting: Emergency Medicine

## 2019-02-28 ENCOUNTER — Other Ambulatory Visit: Payer: Self-pay

## 2019-02-28 ENCOUNTER — Emergency Department (HOSPITAL_COMMUNITY): Payer: Self-pay

## 2019-02-28 DIAGNOSIS — Z5321 Procedure and treatment not carried out due to patient leaving prior to being seen by health care provider: Secondary | ICD-10-CM | POA: Insufficient documentation

## 2019-02-28 DIAGNOSIS — M25511 Pain in right shoulder: Secondary | ICD-10-CM | POA: Insufficient documentation

## 2019-02-28 NOTE — ED Triage Notes (Signed)
Patient here from home with complaints of right shoulder pain. Reports dislocation hx and states that "they were unable to fix the problem". Denies recent injury.

## 2019-02-28 NOTE — ED Notes (Signed)
RETURNED FROM OUTSIDE 

## 2019-03-01 ENCOUNTER — Other Ambulatory Visit (HOSPITAL_COMMUNITY)
Admission: RE | Admit: 2019-03-01 | Discharge: 2019-03-01 | Disposition: A | Discharge status: Home / Self Care | Payer: Self-pay | Source: Ambulatory Visit | Attending: Orthopedic Surgery | Admitting: Orthopedic Surgery

## 2019-03-01 DIAGNOSIS — Z20828 Contact with and (suspected) exposure to other viral communicable diseases: Secondary | ICD-10-CM | POA: Insufficient documentation

## 2019-03-01 DIAGNOSIS — Z01812 Encounter for preprocedural laboratory examination: Secondary | ICD-10-CM | POA: Insufficient documentation

## 2019-03-01 LAB — SARS CORONAVIRUS 2 (TAT 6-24 HRS): SARS Coronavirus 2: NEGATIVE

## 2019-03-02 ENCOUNTER — Encounter (HOSPITAL_COMMUNITY): Payer: Self-pay | Admitting: *Deleted

## 2019-03-02 ENCOUNTER — Other Ambulatory Visit: Payer: Self-pay

## 2019-03-02 NOTE — Anesthesia Preprocedure Evaluation (Addendum)
Anesthesia Evaluation  Patient identified by MRN, date of birth, ID band Patient awake    Reviewed: Allergy & Precautions, H&P , NPO status , Patient's Chart, lab work & pertinent test results  Airway Mallampati: II  TM Distance: >3 FB Neck ROM: Full    Dental no notable dental hx. (+) Teeth Intact, Dental Advisory Given   Pulmonary neg pulmonary ROS,    Pulmonary exam normal breath sounds clear to auscultation       Cardiovascular Exercise Tolerance: Good hypertension, Pt. on medications  Rhythm:Regular Rate:Normal     Neuro/Psych negative neurological ROS  negative psych ROS   GI/Hepatic negative GI ROS, Neg liver ROS,   Endo/Other  diabetes, Insulin Dependent, Oral Hypoglycemic Agents  Renal/GU negative Renal ROS  negative genitourinary   Musculoskeletal   Abdominal   Peds  Hematology negative hematology ROS (+)   Anesthesia Other Findings   Reproductive/Obstetrics negative OB ROS                           Anesthesia Physical Anesthesia Plan  ASA: III  Anesthesia Plan: General   Post-op Pain Management:  Regional for Post-op pain   Induction: Intravenous  PONV Risk Score and Plan: 2 and Ondansetron and Midazolam  Airway Management Planned: Oral ETT  Additional Equipment:   Intra-op Plan:   Post-operative Plan: Extubation in OR  Informed Consent: I have reviewed the patients History and Physical, chart, labs and discussed the procedure including the risks, benefits and alternatives for the proposed anesthesia with the patient or authorized representative who has indicated his/her understanding and acceptance.     Dental advisory given  Plan Discussed with: CRNA  Anesthesia Plan Comments: (Pt recently admitted 9/24-9/27/20 after unwitnessed LOC event. Seizure vs DKA. He reportedly has history of seizure 4yrs ago, unknown cause, never on antiepileptics. HbA1c was 10.8%.,   Blood sugar was 385 with anion gap of 16 and blood CO2 of 19. EEG done during admission was negative for seizure activity. CT head without contrast was normal.  Brain without contrast was normal. Left shoulder x-ray revealed "Posterior dislocation with reverse Hill-Sachs injury". Right shoulder x-ray revealed "Findings suspicious for posterior right shoulder dislocation with reverse Hill-Sachs injury". Bilateral shoulder reductions performed in ED. Echocardiogram revealed "Left ventricular ejection fraction, by visual estimation, is 70 to 75%. The left ventricle has hyperdynamic function. Normal left ventricular size. There is no left ventricular hypertrophy.  Global right ventricle has normal systolic function.The right ventricular size is normal. No increase in right ventricular wall thickness". He was discharged on Lantus, Metformin, and Keppra with orthopedic and neurology followup.   Pt presented back to the ED 02/12/19 with fall and reinjury to R shoulder. XR showed posterior dislocation. Reduction attempted in ED but post reduction XR showed persistent dislocation. He was discharged with ortho followup. There was again question about seizure activity in the setting of fall with shoulder reinjury. Pt was not taking Keppra due to reported side effects. He was advised to d/c Keppra and was stared on Depakote. His outpatient neurology followup is scheduled Jan 2021.  Pt is same day workup and will need DOS labs and eval by anesthesia.  EKG 02/12/19: Sinus tachycardia rate 114. RBBB and LPFB.  TTE 01/14/19: IMPRESSIONS  1. Left ventricular ejection fraction, by visual estimation, is 70 to 75%. The left ventricle has hyperdynamic function. Normal left ventricular size. There is no left ventricular hypertrophy.  2. Global right ventricle has normal  systolic function.The right ventricular size is normal. No increase in right ventricular wall thickness. )      Anesthesia Quick Evaluation

## 2019-03-02 NOTE — Progress Notes (Signed)
Anesthesia Chart Review: Pt recently admitted 9/24-9/27/20 after unwitnessed LOC event. Seizure vs DKA. He reportedly has history of seizure 20yrs ago, unknown cause, never on antiepileptics. HbA1c was 10.8%.,  Blood sugar was 385 with anion gap of 16 and blood CO2 of 19. EEG done during admission was negative for seizure activity. CT head without contrast was normal.  Brain without contrast was normal. Left shoulder x-ray revealed "Posterior dislocation with reverse Hill-Sachs injury". Right shoulder x-ray revealed "Findings suspicious for posterior right shoulder dislocation with reverse Hill-Sachs injury". Bilateral shoulder reductions performed in ED. Echocardiogram revealed "Left ventricular ejection fraction, by visual estimation, is 70 to 75%. The left ventricle has hyperdynamic function. Normal left ventricular size. There is no left ventricular hypertrophy.  Global right ventricle has normal systolic function.The right ventricular size is normal. No increase in right ventricular wall thickness". He was discharged on Lantus, Metformin, and Keppra with orthopedic and neurology followup.   Pt presented back to the ED 02/12/19 with fall and reinjury to R shoulder. XR showed posterior dislocation. Reduction attempted in ED but post reduction XR showed persistent dislocation. He was discharged with ortho followup. There was again question about seizure activity in the setting of fall with shoulder reinjury. Pt was not taking Keppra due to reported side effects. He was advised to d/c Keppra and was stared on Depakote. His outpatient neurology followup is scheduled Jan 2021.  Pt is same day workup and will need DOS labs and eval by anesthesia.  EKG 02/12/19: Sinus tachycardia rate 114. RBBB and LPFB.  TTE 01/14/19: IMPRESSIONS  1. Left ventricular ejection fraction, by visual estimation, is 70 to 75%. The left ventricle has hyperdynamic function. Normal left ventricular size. There is no left ventricular  hypertrophy.  2. Global right ventricle has normal systolic function.The right ventricular size is normal. No increase in right ventricular wall thickness.    Wynonia Musty [facility]/Anesthesiology Phone [phone] 03/02/2019 10:43 AM

## 2019-03-02 NOTE — Progress Notes (Signed)
Patient denies shortness of breath, fever, cough and chest pain.  PCP - Dr Dana Allan  Cardiologist - denies  Chest x-ray - denies EKG - 02/12/19 Stress Test - denies ECHO - 01/14/19 Cardiac Cath - denies  Fasting Blood Sugar - low 200s Checks Blood Sugar 2-3 times a day  . Do not take oral diabetes medicines (metformin) the morning of surgery.  . Take 5 units of your lantus morning dose insulin on day of surgery-Thursday.   . If your blood sugar is less than 70 mg/dL, you will need to treat for low blood sugar: o Treat a low blood sugar (less than 70 mg/dL) with  cup of clear juice (cranberry or apple), 4 glucose tablets, OR glucose gel. o Recheck blood sugar in 15 minutes after treatment (to make sure it is greater than 70 mg/dL). If your blood sugar is not greater than 70 mg/dL on recheck, call [phone] for further instructions.  Aspirin Instructions: Follow your surgeon's instructions on when to stop aspirin prior to surgery,  If no instructions were given by your surgeon then you will need to call the office for those instructions.  ERAS: Clears til 4:30 am DOS, no drink.  Anesthesia review: Yes  STOP now taking any Aspirin (unless otherwise instructed by your surgeon), Aleve, Naproxen, Ibuprofen, Motrin, Advil, Goody's, BC's, all herbal medications, fish oil, and all vitamins.   Coronavirus Screening Covid test on 03/01/19 was negative.  Patient verbalized understanding of instructions that were given via phone.

## 2019-03-03 ENCOUNTER — Observation Stay (HOSPITAL_COMMUNITY)
Admission: RE | Admit: 2019-03-03 | Discharge: 2019-03-04 | Disposition: A | Discharge status: Home / Self Care | Payer: Self-pay | Attending: Orthopedic Surgery | Admitting: Orthopedic Surgery

## 2019-03-03 ENCOUNTER — Encounter (HOSPITAL_COMMUNITY)
Admission: RE | Disposition: A | Discharge status: Home / Self Care | Payer: Self-pay | Source: Home / Self Care | Attending: Orthopedic Surgery

## 2019-03-03 ENCOUNTER — Encounter (HOSPITAL_COMMUNITY): Payer: Self-pay

## 2019-03-03 ENCOUNTER — Observation Stay (HOSPITAL_COMMUNITY): Payer: Self-pay

## 2019-03-03 ENCOUNTER — Ambulatory Visit (HOSPITAL_COMMUNITY): Payer: Self-pay | Admitting: Physician Assistant

## 2019-03-03 ENCOUNTER — Other Ambulatory Visit: Payer: Self-pay

## 2019-03-03 DIAGNOSIS — Z833 Family history of diabetes mellitus: Secondary | ICD-10-CM | POA: Insufficient documentation

## 2019-03-03 DIAGNOSIS — E119 Type 2 diabetes mellitus without complications: Secondary | ICD-10-CM | POA: Insufficient documentation

## 2019-03-03 DIAGNOSIS — G40909 Epilepsy, unspecified, not intractable, without status epilepticus: Secondary | ICD-10-CM | POA: Insufficient documentation

## 2019-03-03 DIAGNOSIS — X58XXXA Exposure to other specified factors, initial encounter: Secondary | ICD-10-CM | POA: Insufficient documentation

## 2019-03-03 DIAGNOSIS — Z79899 Other long term (current) drug therapy: Secondary | ICD-10-CM | POA: Insufficient documentation

## 2019-03-03 DIAGNOSIS — M25311 Other instability, right shoulder: Secondary | ICD-10-CM | POA: Insufficient documentation

## 2019-03-03 DIAGNOSIS — E785 Hyperlipidemia, unspecified: Secondary | ICD-10-CM | POA: Insufficient documentation

## 2019-03-03 DIAGNOSIS — I10 Essential (primary) hypertension: Secondary | ICD-10-CM | POA: Insufficient documentation

## 2019-03-03 DIAGNOSIS — Z7982 Long term (current) use of aspirin: Secondary | ICD-10-CM | POA: Insufficient documentation

## 2019-03-03 DIAGNOSIS — Z96611 Presence of right artificial shoulder joint: Secondary | ICD-10-CM

## 2019-03-03 DIAGNOSIS — M24411 Recurrent dislocation, right shoulder: Principal | ICD-10-CM | POA: Insufficient documentation

## 2019-03-03 DIAGNOSIS — S43021A Posterior subluxation of right humerus, initial encounter: Secondary | ICD-10-CM | POA: Diagnosis present

## 2019-03-03 DIAGNOSIS — Z791 Long term (current) use of non-steroidal anti-inflammatories (NSAID): Secondary | ICD-10-CM | POA: Insufficient documentation

## 2019-03-03 DIAGNOSIS — Z794 Long term (current) use of insulin: Secondary | ICD-10-CM | POA: Insufficient documentation

## 2019-03-03 DIAGNOSIS — S42291A Other displaced fracture of upper end of right humerus, initial encounter for closed fracture: Secondary | ICD-10-CM | POA: Insufficient documentation

## 2019-03-03 HISTORY — PX: SHOULDER HEMI-ARTHROPLASTY: SHX5049

## 2019-03-03 HISTORY — DX: Hyperlipidemia, unspecified: E78.5

## 2019-03-03 LAB — GLUCOSE, CAPILLARY
Glucose-Capillary: 164 mg/dL — ABNORMAL HIGH (ref 70–99)
Glucose-Capillary: 137 mg/dL — ABNORMAL HIGH (ref 70–99)
Glucose-Capillary: 145 mg/dL — ABNORMAL HIGH (ref 70–99)
Glucose-Capillary: 203 mg/dL — ABNORMAL HIGH (ref 70–99)

## 2019-03-03 LAB — COMPREHENSIVE METABOLIC PANEL
ALT: 33 U/L (ref 0–44)
AST: 23 U/L (ref 15–41)
Albumin: 4.3 g/dL (ref 3.5–5.0)
Alkaline Phosphatase: 72 U/L (ref 38–126)
Anion gap: 13 (ref 5–15)
BUN: 12 mg/dL (ref 6–20)
CO2: 23 mmol/L (ref 22–32)
Calcium: 9.3 mg/dL (ref 8.9–10.3)
Chloride: 98 mmol/L (ref 98–111)
Creatinine, Ser: 0.87 mg/dL (ref 0.61–1.24)
GFR calc Af Amer: >60 mL/min (ref >60)
GFR calc non Af Amer: >60 mL/min (ref >60)
Glucose, Bld: 166 mg/dL — ABNORMAL HIGH (ref 70–99)
Potassium: 4 mmol/L (ref 3.5–5.1)
Sodium: 134 mmol/L — ABNORMAL LOW (ref 135–145)
Total Bilirubin: 0.7 mg/dL (ref 0.3–1.2)
Total Protein: 7.2 g/dL (ref 6.5–8.1)

## 2019-03-03 LAB — CBC
HCT: 42.6 % (ref 39.0–52.0)
Hemoglobin: 14.5 g/dL (ref 13.0–17.0)
MCH: 30.1 pg (ref 26.0–34.0)
MCHC: 34 g/dL (ref 30.0–36.0)
MCV: 88.6 fL (ref 80.0–100.0)
Platelets: 348 10*3/uL (ref 150–400)
RBC: 4.81 MIL/uL (ref 4.22–5.81)
RDW: 12.1 % (ref 11.5–15.5)
WBC: 10.1 10*3/uL (ref 4.0–10.5)
nRBC: 0 % (ref 0.0–0.2)

## 2019-03-03 SURGERY — HEMIARTHROPLASTY, SHOULDER
Anesthesia: General | Site: Shoulder | Laterality: Right

## 2019-03-03 MED ORDER — PHENOL 1.4 % MT LIQD: 1.0000 | OROMUCOSAL | Status: DC | PRN

## 2019-03-03 MED ORDER — ASPIRIN EC 81 MG PO TBEC
81.0000 mg | DELAYED_RELEASE_TABLET | Freq: Every day | ORAL | Status: DC
  Administered 2019-03-03 – 2019-03-04 (×2): 81 mg via ORAL
  Filled 2019-03-03 (×2): qty 1

## 2019-03-03 MED ORDER — HYDROCODONE-ACETAMINOPHEN 7.5-325 MG PO TABS: 1.0000 | ORAL_TABLET | ORAL | Status: DC | PRN

## 2019-03-03 MED ORDER — LIDOCAINE 2% (20 MG/ML) 5 ML SYRINGE
INTRAMUSCULAR | Status: DC | PRN
  Administered 2019-03-03: 40 mg via INTRAVENOUS

## 2019-03-03 MED ORDER — ACETAMINOPHEN 325 MG PO TABS: 325.0000 mg | ORAL_TABLET | Freq: Four times a day (QID) | ORAL | Status: DC | PRN

## 2019-03-03 MED ORDER — PROPOFOL 10 MG/ML IV BOLUS
INTRAVENOUS | Status: DC | PRN
  Administered 2019-03-03: 200 mg via INTRAVENOUS

## 2019-03-03 MED ORDER — LISINOPRIL 10 MG PO TABS
10.0000 mg | ORAL_TABLET | Freq: Every day | ORAL | Status: DC
  Administered 2019-03-04: 10 mg via ORAL
  Filled 2019-03-03: qty 2

## 2019-03-03 MED ORDER — CEFAZOLIN SODIUM-DEXTROSE 1-4 GM/50ML-% IV SOLN
1.0000 g | Freq: Four times a day (QID) | INTRAVENOUS | Status: AC
  Administered 2019-03-03 – 2019-03-04 (×3): 1 g via INTRAVENOUS
  Filled 2019-03-03 (×3): qty 50

## 2019-03-03 MED ORDER — MIDAZOLAM HCL 2 MG/2ML IJ SOLN
INTRAMUSCULAR | Status: AC
  Filled 2019-03-03: qty 2

## 2019-03-03 MED ORDER — CHLORHEXIDINE GLUCONATE 4 % EX LIQD: 60.0000 mL | Freq: Once | CUTANEOUS | Status: DC

## 2019-03-03 MED ORDER — LACTATED RINGERS IV SOLN
INTRAVENOUS | Status: DC | PRN
  Administered 2019-03-03: 07:00:00 via INTRAVENOUS

## 2019-03-03 MED ORDER — ROCURONIUM BROMIDE 10 MG/ML (PF) SYRINGE
PREFILLED_SYRINGE | INTRAVENOUS | Status: DC | PRN
  Administered 2019-03-03: 60 mg via INTRAVENOUS
  Administered 2019-03-03: 20 mg via INTRAVENOUS

## 2019-03-03 MED ORDER — MIDAZOLAM HCL 2 MG/2ML IJ SOLN
INTRAMUSCULAR | Status: DC | PRN
  Administered 2019-03-03: 2 mg via INTRAVENOUS

## 2019-03-03 MED ORDER — METHOCARBAMOL 1000 MG/10ML IJ SOLN
500.0000 mg | Freq: Four times a day (QID) | INTRAVENOUS | Status: DC | PRN
  Filled 2019-03-03: qty 5

## 2019-03-03 MED ORDER — PROMETHAZINE HCL 25 MG/ML IJ SOLN
INTRAMUSCULAR | Status: AC
  Filled 2019-03-03: qty 1

## 2019-03-03 MED ORDER — DOCUSATE SODIUM 100 MG PO CAPS
100.0000 mg | ORAL_CAPSULE | Freq: Two times a day (BID) | ORAL | Status: DC
  Administered 2019-03-03 – 2019-03-04 (×2): 100 mg via ORAL
  Filled 2019-03-03 (×2): qty 1

## 2019-03-03 MED ORDER — BUPIVACAINE-EPINEPHRINE (PF) 0.5% -1:200000 IJ SOLN
INTRAMUSCULAR | Status: DC | PRN
  Administered 2019-03-03: 15 mL via PERINEURAL

## 2019-03-03 MED ORDER — TRANEXAMIC ACID-NACL 1000-0.7 MG/100ML-% IV SOLN
1000.0000 mg | Freq: Once | INTRAVENOUS | Status: AC
  Administered 2019-03-03: 1000 mg via INTRAVENOUS
  Filled 2019-03-03: qty 100

## 2019-03-03 MED ORDER — HYDROCODONE-ACETAMINOPHEN 5-325 MG PO TABS
1.0000 | ORAL_TABLET | ORAL | Status: DC | PRN
  Administered 2019-03-03: 1 via ORAL
  Administered 2019-03-04: 2 via ORAL
  Administered 2019-03-04: 1 via ORAL
  Filled 2019-03-03: qty 1
  Filled 2019-03-03: qty 2
  Filled 2019-03-03: qty 1

## 2019-03-03 MED ORDER — FENTANYL CITRATE (PF) 250 MCG/5ML IJ SOLN
INTRAMUSCULAR | Status: DC | PRN
  Administered 2019-03-03 (×3): 50 ug via INTRAVENOUS

## 2019-03-03 MED ORDER — BUPIVACAINE LIPOSOME 1.3 % IJ SUSP
INTRAMUSCULAR | Status: DC | PRN
  Administered 2019-03-03: 10 mL via PERINEURAL

## 2019-03-03 MED ORDER — PHENYLEPHRINE HCL-NACL 10-0.9 MG/250ML-% IV SOLN
INTRAVENOUS | Status: DC | PRN
  Administered 2019-03-03: 20 ug/min via INTRAVENOUS

## 2019-03-03 MED ORDER — ONDANSETRON HCL 4 MG/2ML IJ SOLN
INTRAMUSCULAR | Status: AC
  Filled 2019-03-03: qty 2

## 2019-03-03 MED ORDER — ACETAMINOPHEN 500 MG PO TABS
500.0000 mg | ORAL_TABLET | Freq: Four times a day (QID) | ORAL | Status: AC
  Administered 2019-03-03 – 2019-03-04 (×4): 500 mg via ORAL
  Filled 2019-03-03 (×4): qty 1

## 2019-03-03 MED ORDER — NAPROXEN 250 MG PO TABS
500.0000 mg | ORAL_TABLET | Freq: Two times a day (BID) | ORAL | Status: DC
  Administered 2019-03-03 – 2019-03-04 (×2): 500 mg via ORAL
  Filled 2019-03-03 (×3): qty 2

## 2019-03-03 MED ORDER — DIVALPROEX SODIUM 250 MG PO DR TAB
250.0000 mg | DELAYED_RELEASE_TABLET | Freq: Three times a day (TID) | ORAL | Status: DC
  Administered 2019-03-03 – 2019-03-04 (×3): 250 mg via ORAL
  Filled 2019-03-03 (×5): qty 1

## 2019-03-03 MED ORDER — HYDROMORPHONE HCL 1 MG/ML IJ SOLN: 0.2500 mg | INTRAMUSCULAR | Status: DC | PRN

## 2019-03-03 MED ORDER — ATORVASTATIN CALCIUM 10 MG PO TABS
20.0000 mg | ORAL_TABLET | Freq: Every day | ORAL | Status: DC
  Administered 2019-03-03: 20 mg via ORAL
  Filled 2019-03-03: qty 2

## 2019-03-03 MED ORDER — PROPOFOL 10 MG/ML IV BOLUS
INTRAVENOUS | Status: AC
  Filled 2019-03-03: qty 40

## 2019-03-03 MED ORDER — VANCOMYCIN HCL 1000 MG IV SOLR
INTRAVENOUS | Status: AC
  Filled 2019-03-03: qty 1000

## 2019-03-03 MED ORDER — FENTANYL CITRATE (PF) 250 MCG/5ML IJ SOLN
INTRAMUSCULAR | Status: AC
  Filled 2019-03-03: qty 5

## 2019-03-03 MED ORDER — VANCOMYCIN HCL 1000 MG IV SOLR
INTRAVENOUS | Status: DC | PRN
  Administered 2019-03-03: 1000 mg via TOPICAL

## 2019-03-03 MED ORDER — METOCLOPRAMIDE HCL 5 MG/ML IJ SOLN: 5.0000 mg | Freq: Three times a day (TID) | INTRAMUSCULAR | Status: DC | PRN

## 2019-03-03 MED ORDER — TRANEXAMIC ACID-NACL 1000-0.7 MG/100ML-% IV SOLN
1000.0000 mg | INTRAVENOUS | Status: AC
  Administered 2019-03-03: 1000 mg via INTRAVENOUS
  Filled 2019-03-03 (×2): qty 100

## 2019-03-03 MED ORDER — LIDOCAINE 2% (20 MG/ML) 5 ML SYRINGE
INTRAMUSCULAR | Status: AC
  Filled 2019-03-03: qty 5

## 2019-03-03 MED ORDER — SUGAMMADEX SODIUM 200 MG/2ML IV SOLN
INTRAVENOUS | Status: DC | PRN
  Administered 2019-03-03: 200 mg via INTRAVENOUS

## 2019-03-03 MED ORDER — LEVETIRACETAM 500 MG PO TABS
500.0000 mg | ORAL_TABLET | Freq: Two times a day (BID) | ORAL | Status: DC
  Administered 2019-03-03 – 2019-03-04 (×3): 500 mg via ORAL
  Filled 2019-03-03 (×3): qty 1

## 2019-03-03 MED ORDER — CEFAZOLIN SODIUM-DEXTROSE 2-4 GM/100ML-% IV SOLN
2.0000 g | INTRAVENOUS | Status: AC
  Administered 2019-03-03: 08:00:00 2 g via INTRAVENOUS
  Filled 2019-03-03: qty 100

## 2019-03-03 MED ORDER — ONDANSETRON HCL 4 MG/2ML IJ SOLN
INTRAMUSCULAR | Status: DC | PRN
  Administered 2019-03-03: 4 mg via INTRAVENOUS

## 2019-03-03 MED ORDER — ONDANSETRON HCL 4 MG PO TABS: 4.0000 mg | ORAL_TABLET | Freq: Four times a day (QID) | ORAL | Status: DC | PRN

## 2019-03-03 MED ORDER — METOCLOPRAMIDE HCL 5 MG PO TABS: 5.0000 mg | ORAL_TABLET | Freq: Three times a day (TID) | ORAL | Status: DC | PRN

## 2019-03-03 MED ORDER — PHENYLEPHRINE HCL (PRESSORS) 10 MG/ML IV SOLN
INTRAVENOUS | Status: AC
  Filled 2019-03-03: qty 1

## 2019-03-03 MED ORDER — METFORMIN HCL 500 MG PO TABS
1000.0000 mg | ORAL_TABLET | Freq: Two times a day (BID) | ORAL | Status: DC
  Administered 2019-03-03 – 2019-03-04 (×2): 1000 mg via ORAL
  Filled 2019-03-03 (×2): qty 2

## 2019-03-03 MED ORDER — ONDANSETRON HCL 4 MG/2ML IJ SOLN
4.0000 mg | Freq: Four times a day (QID) | INTRAMUSCULAR | Status: DC | PRN
  Administered 2019-03-03: 4 mg via INTRAVENOUS
  Filled 2019-03-03: qty 2

## 2019-03-03 MED ORDER — PROMETHAZINE HCL 25 MG/ML IJ SOLN
6.2500 mg | Freq: Once | INTRAMUSCULAR | Status: AC
  Administered 2019-03-03: 6.25 mg via INTRAVENOUS

## 2019-03-03 MED ORDER — ACETAMINOPHEN 500 MG PO TABS
1000.0000 mg | ORAL_TABLET | Freq: Once | ORAL | Status: AC
  Administered 2019-03-03: 1000 mg via ORAL
  Filled 2019-03-03: qty 2

## 2019-03-03 MED ORDER — METHOCARBAMOL 500 MG PO TABS: 500.0000 mg | ORAL_TABLET | Freq: Four times a day (QID) | ORAL | Status: DC | PRN

## 2019-03-03 MED ORDER — MORPHINE SULFATE (PF) 2 MG/ML IV SOLN
0.5000 mg | INTRAVENOUS | Status: DC | PRN
  Administered 2019-03-04: 1 mg via INTRAVENOUS
  Filled 2019-03-03: qty 1

## 2019-03-03 MED ORDER — MENTHOL 3 MG MT LOZG: 1.0000 | LOZENGE | OROMUCOSAL | Status: DC | PRN

## 2019-03-03 MED ORDER — 0.9 % SODIUM CHLORIDE (POUR BTL) OPTIME
TOPICAL | Status: DC | PRN
  Administered 2019-03-03: 1000 mL

## 2019-03-03 MED ORDER — ROCURONIUM BROMIDE 10 MG/ML (PF) SYRINGE
PREFILLED_SYRINGE | INTRAVENOUS | Status: AC
  Filled 2019-03-03: qty 10

## 2019-03-03 SURGICAL SUPPLY — 64 items
ANCHOR SUT 2.6 FBRTK DBL SUTP (Anchor) ×6 IMPLANT
ANCHOR SUT BIO SW 4.75X19.1 (Anchor) ×6 IMPLANT
BIT DRILL 5/64X5 DISP (BIT) ×3 IMPLANT
BLADE SAG 18X100X1.27 (BLADE) IMPLANT
BODY TRUNION ECLIPSE 41 SL (Shoulder) ×3 IMPLANT
CLOSURE STERI-STRIP 1/2X4 (GAUZE/BANDAGES/DRESSINGS) ×1
CLOSURE WOUND 1/2 X4 (GAUZE/BANDAGES/DRESSINGS) ×1
CLSR STERI-STRIP ANTIMIC 1/2X4 (GAUZE/BANDAGES/DRESSINGS) ×2 IMPLANT
COVER SURGICAL LIGHT HANDLE (MISCELLANEOUS) ×3 IMPLANT
DRAPE INCISE IOBAN 66X45 STRL (DRAPES) IMPLANT
DRAPE ORTHO SPLIT 77X108 STRL (DRAPES) ×4
DRAPE SURG ORHT 6 SPLT 77X108 (DRAPES) ×2 IMPLANT
DRAPE U-SHAPE 47X51 STRL (DRAPES) ×3 IMPLANT
DRESSING AQUACEL AG SP 3.5X6 (GAUZE/BANDAGES/DRESSINGS) ×1 IMPLANT
DRSG ADAPTIC 3X8 NADH LF (GAUZE/BANDAGES/DRESSINGS) ×3 IMPLANT
DRSG AQUACEL AG SP 3.5X6 (GAUZE/BANDAGES/DRESSINGS) ×3
DRSG PAD ABDOMINAL 8X10 ST (GAUZE/BANDAGES/DRESSINGS) ×3 IMPLANT
DURAPREP 26ML APPLICATOR (WOUND CARE) ×3 IMPLANT
ELECT BLADE 4.0 EZ CLEAN MEGAD (MISCELLANEOUS) ×3
ELECT REM PT RETURN 9FT ADLT (ELECTROSURGICAL) ×3
ELECTRODE BLDE 4.0 EZ CLN MEGD (MISCELLANEOUS) ×1 IMPLANT
ELECTRODE REM PT RTRN 9FT ADLT (ELECTROSURGICAL) ×1 IMPLANT
GAUZE SPONGE 4X4 12PLY STRL (GAUZE/BANDAGES/DRESSINGS) ×3 IMPLANT
GAUZE SPONGE 4X4 12PLY STRL LF (GAUZE/BANDAGES/DRESSINGS) ×3 IMPLANT
GLOVE BIO SURGEON STRL SZ7.5 (GLOVE) ×3 IMPLANT
GLOVE BIOGEL PI IND STRL 7.0 (GLOVE) ×3 IMPLANT
GLOVE BIOGEL PI IND STRL 8 (GLOVE) ×1 IMPLANT
GLOVE BIOGEL PI INDICATOR 7.0 (GLOVE) ×6
GLOVE BIOGEL PI INDICATOR 8 (GLOVE) ×2
GLOVE SURG SS PI 6.5 STRL IVOR (GLOVE) ×6 IMPLANT
GLOVE SURG SS PI 7.0 STRL IVOR (GLOVE) ×3 IMPLANT
GOWN STRL REUS W/ TWL LRG LVL3 (GOWN DISPOSABLE) IMPLANT
GOWN STRL REUS W/ TWL XL LVL3 (GOWN DISPOSABLE) ×2 IMPLANT
GOWN STRL REUS W/TWL LRG LVL3 (GOWN DISPOSABLE)
GOWN STRL REUS W/TWL XL LVL3 (GOWN DISPOSABLE) ×4
HEAD HUM ECLIPSE 45/19 (Shoulder) ×3 IMPLANT
KIT ANCHOR FBRTK 2.6 STR (KITS) ×3 IMPLANT
KIT BASIN OR (CUSTOM PROCEDURE TRAY) ×3 IMPLANT
KIT TURNOVER KIT B (KITS) ×3 IMPLANT
MANIFOLD NEPTUNE II (INSTRUMENTS) IMPLANT
NEEDLE TAPERED W/ NITINOL LOOP (MISCELLANEOUS) ×3 IMPLANT
NS IRRIG 1000ML POUR BTL (IV SOLUTION) ×3 IMPLANT
PACK SHOULDER (CUSTOM PROCEDURE TRAY) ×3 IMPLANT
PAD ABD 8X10 STRL (GAUZE/BANDAGES/DRESSINGS) ×3 IMPLANT
PAD ARMBOARD 7.5X6 YLW CONV (MISCELLANEOUS) ×6 IMPLANT
RESTRAINT HEAD UNIVERSAL NS (MISCELLANEOUS) ×3 IMPLANT
SCREW MED ECLIPSE 35 (Screw) ×3 IMPLANT
SIZER ECLIPSE CAGE SCREW (ORTHOPEDIC DISPOSABLE SUPPLIES) ×3 IMPLANT
SLING ARM FOAM STRAP LRG (SOFTGOODS) ×3 IMPLANT
SLING ARM FOAM STRAP MED (SOFTGOODS) IMPLANT
SLING ARM IMMOBILIZER LRG (SOFTGOODS) ×3 IMPLANT
SPONGE LAP 18X18 RF (DISPOSABLE) ×3 IMPLANT
STRIP CLOSURE SKIN 1/2X4 (GAUZE/BANDAGES/DRESSINGS) ×2 IMPLANT
SUCTION FRAZIER HANDLE 10FR (MISCELLANEOUS) ×2
SUCTION TUBE FRAZIER 10FR DISP (MISCELLANEOUS) ×1 IMPLANT
SUT FIBERWIRE #2 38 T-5 BLUE (SUTURE) ×6
SUT MNCRL AB 4-0 PS2 18 (SUTURE) ×3 IMPLANT
SUT VIC AB 2-0 CT1 27 (SUTURE) ×2
SUT VIC AB 2-0 CT1 TAPERPNT 27 (SUTURE) ×1 IMPLANT
SUTURE FIBERWR #2 38 T-5 BLUE (SUTURE) ×2 IMPLANT
TAPE CLOTH SURG 4X10 WHT LF (GAUZE/BANDAGES/DRESSINGS) ×3 IMPLANT
TOWEL GREEN STERILE (TOWEL DISPOSABLE) ×3 IMPLANT
WATER STERILE IRR 1000ML POUR (IV SOLUTION) ×3 IMPLANT
YANKAUER SUCT BULB TIP NO VENT (SUCTIONS) ×3 IMPLANT

## 2019-03-03 NOTE — Discharge Instructions (Signed)
Discharge instructions: -Maintain arm in sling at all times unless doing activities of daily living or changing closed.  -Maintain postoperative bandage until your follow-up appointment in 2 weeks.  -For mild to moderate pain use Tylenol and Advil as well as ice to the right shoulder for 30 minutes at a time around-the-clock.  -For breakthrough pain use oxycodone as needed.  -No lifting with the right arm.  -Return to see Dr. Stann Mainland in 2 weeks for routine postop care.  -You may shower with your postoperative bandage in place.  Please do not submerge underwater.  Maintain this bandage for 2 weeks postoperatively until your follow-up appointment.

## 2019-03-03 NOTE — H&P (Signed)
ORTHOPAEDIC H and P  REQUESTING PHYSICIAN: Nicholes Stairs, MD  PCP:  Patient, No Pcp Per  Chief Complaint: Right shoulder locked dislocation  HPI: Alan Alvarez is a 38 y.o. male who complains of right shoulder posterior locked dislocation.  He has history of epilepsy and has had multiple posterior dislocations.  The most recent of which which was not able to be closed reduced.  He is here today for open surgical intervention with hemiarthroplasty.  No new complaints.  Past Medical History:  Diagnosis Date  . Diabetes mellitus without complication ([place])    type 2  . History of seizure   . Hyperlipidemia   . Hypertension    History reviewed. No pertinent surgical history. Social History   Socioeconomic History  . Marital status: Single    Spouse name: Not on file  . Number of children: Not on file  . Years of education: Not on file  . Highest education level: Not on file  Occupational History  . Not on file  Social Needs  . Financial resource strain: Not on file  . Food insecurity    Worry: Not on file    Inability: Not on file  . Transportation needs    Medical: Not on file    Non-medical: Not on file  Tobacco Use  . Smoking status: Never Smoker  . Smokeless tobacco: Never Used  Substance and Sexual Activity  . Alcohol use: Not Currently    Frequency: Never  . Drug use: Not Currently  . Sexual activity: Yes  Lifestyle  . Physical activity    Days per week: Not on file    Minutes per session: Not on file  . Stress: Not on file  Relationships  . Social Herbalist on phone: Not on file    Gets together: Not on file    Attends religious service: Not on file    Active member of club or organization: Not on file    Attends meetings of clubs or organizations: Not on file    Relationship status: Not on file  Other Topics Concern  . Not on file  Social History Narrative  . Not on file   Family History  Problem Relation Age of Onset  .  Diabetes Other   . Sudden Cardiac Death Neg Hx    No Known Allergies Prior to Admission medications   Medication Sig Start Date End Date Taking? Authorizing Provider  divalproex (DEPAKOTE) 250 MG DR tablet Take 1 tablet (250 mg total) by mouth 3 (three) times daily. 02/12/19  Yes Carmin Muskrat, MD  insulin glargine (LANTUS) 100 UNIT/ML injection Inject 0.1 mLs (10 Units total) into the skin daily. 01/16/19  Yes Dana Allan I, MD  ketorolac (TORADOL) 10 MG tablet Take 10 mg by mouth every 6 (six) hours as needed.   Yes [provider]  lisinopril (ZESTRIL) 10 MG tablet Take 1 tablet (10 mg total) by mouth daily. 01/17/19  Yes Dana Allan I, MD  metFORMIN (GLUCOPHAGE) 1000 MG tablet Take 1 tablet (1,000 mg total) by mouth 2 (two) times daily with a meal. 01/16/19  Yes Bonnell Public, MD  naproxen (NAPROSYN) 500 MG tablet Take 500 mg by mouth 2 (two) times daily with a meal.   Yes [provider]  oxyCODONE-acetaminophen (PERCOCET) 5-325 MG tablet Take 1 tablet by mouth every 4 (four) hours as needed for severe pain. 02/13/19 02/13/20 Yes Charlann Lange, PA-C  aspirin EC 81 MG tablet Take  1 tablet (81 mg total) by mouth daily. 01/16/19 01/16/20  Dana Allan I, MD  atorvastatin (LIPITOR) 20 MG tablet Take 1 tablet (20 mg total) by mouth daily. 01/16/19 01/16/20  Bonnell Public, MD  blood glucose meter kit and supplies KIT Dispense based on patient and insurance preference. Use up to four times daily as directed. (FOR ICD-9 250.00, 250.01). 01/16/19   Bonnell Public, MD  cyclobenzaprine (FLEXERIL) 5 MG tablet Take 1 tablet (5 mg total) by mouth 2 (two) times daily as needed for muscle spasms. Patient not taking: Reported on 02/12/2019 01/25/19   Hedges, Dellis Filbert, PA-C  ibuprofen (ADVIL) 200 MG tablet Take 200 mg by mouth every 6 (six) hours as needed for moderate pain.    [provider]  levETIRAcetam (KEPPRA) 500 MG tablet Take 1 tablet (500 mg  total) by mouth 2 (two) times daily. 01/16/19   Bonnell Public, MD   No results found.  Positive ROS: All other systems have been reviewed and were otherwise negative with the exception of those mentioned in the HPI and as above.  Physical Exam: General: Alert, no acute distress Cardiovascular: No pedal edema Respiratory: No cyanosis, no use of accessory musculature GI: No organomegaly, abdomen is soft and non-tender Skin: No lesions in the area of chief complaint Neurologic: Sensation intact distally Psychiatric: Patient is competent for consent with normal mood and affect Lymphatic: No axillary or cervical lymphadenopathy  MUSCULOSKELETAL:  Right shoulder is intact with no open wounds.  He does have deformity at the shoulder consistent with posterior dislocation.  Assessment: 1.  Locked and dislocated right posterior shoulder dislocation. 2.  Chronic right shoulder instability  Plan: -Plan is for open reduction with hemiarthroplasty of the humeral head given the large Hill-Sachs lesion.  We again reviewed this plan with the patient.  All questions were solicited and answered to his satisfaction.  No new complaints at this time.  He has provided informed consent to proceed.  -We will plan for overnight stay postoperatively routine perioperative care.    Nicholes Stairs, MD Cell [phone]    03/03/2019 7:12 AM

## 2019-03-03 NOTE — Anesthesia Postprocedure Evaluation (Signed)
Anesthesia Post Note  Patient: Alan Alvarez  Procedure(s) Performed: SHOULDER HEMI-ARTHROPLASTY (Right Shoulder)     Patient location during evaluation: PACU Anesthesia Type: General and Regional Level of consciousness: awake and alert Pain management: pain level controlled Vital Signs Assessment: post-procedure vital signs reviewed and stable Respiratory status: spontaneous breathing, nonlabored ventilation and respiratory function stable Cardiovascular status: blood pressure returned to baseline and stable Postop Assessment: no apparent nausea or vomiting Anesthetic complications: no    Last Vitals:  Vitals:   03/03/19 1055 03/03/19 1104  BP: 97/75 95/75  Pulse: 92 89  Resp: 17 16  Temp:  36.6 C  SpO2: 100% 100%    Last Pain:  Vitals:   03/03/19 1104  TempSrc:   PainSc: Asleep                 Boomer Winders,W. EDMOND

## 2019-03-03 NOTE — Plan of Care (Signed)

## 2019-03-03 NOTE — Brief Op Note (Signed)
03/03/2019  9:26 AM  PATIENT:  Alan Alvarez  38 y.o. male  PRE-OPERATIVE DIAGNOSIS:  Right shoulder posterior dislocation  POST-OPERATIVE DIAGNOSIS:  Right shoulder posterior dislocation  PROCEDURE:  Procedure(s): SHOULDER HEMI-ARTHROPLASTY (Right)  SURGEON:  Surgeon(s) and Role:    * Nicholes Stairs, MD - Primary  PHYSICIAN ASSISTANT:   ASSISTANTS: Katy Apo, RNFA   ANESTHESIA:   regional and general  EBL:  50 mL   BLOOD ADMINISTERED:none  DRAINS: none   LOCAL MEDICATIONS USED:  NONE  SPECIMEN:  No Specimen  DISPOSITION OF SPECIMEN:  N/A  COUNTS:  YES  TOURNIQUET:  * No tourniquets in log *  DICTATION: .Note written in EPIC  PLAN OF CARE: Admit for overnight observation  PATIENT DISPOSITION:  PACU - hemodynamically stable.   Delay start of Pharmacological VTE agent (>24hrs) due to surgical blood loss or risk of bleeding: not applicable

## 2019-03-03 NOTE — Transfer of Care (Signed)
Immediate Anesthesia Transfer of Care Note  Patient: Alan Alvarez  Procedure(s) Performed: SHOULDER HEMI-ARTHROPLASTY (Right Shoulder)  Patient Location: PACU  Anesthesia Type:General, Regional  Level of Consciousness: awake, patient cooperative and responds to stimulation  Airway & Oxygen Therapy: Patient Spontanous Breathing and Patient connected to nasal cannula oxygen  Post-op Assessment: Report given to RN and Post -op Vital signs reviewed and stable  Post vital signs: Reviewed and stable  Last Vitals:  Vitals Value Taken Time  BP 99/73 03/03/19 0940  Temp    Pulse 101 03/03/19 0941  Resp 22 03/03/19 0941  SpO2 93 % 03/03/19 0941  Vitals shown include unvalidated device data.  Last Pain:  Vitals:   03/03/19 0619  TempSrc: Oral  PainSc:       Patients Stated Pain Goal: 3 (71/24/58 0998)  Complications: No apparent anesthesia complications

## 2019-03-03 NOTE — Anesthesia Procedure Notes (Signed)
Anesthesia Regional Block: Interscalene brachial plexus block   Pre-Anesthetic Checklist: ,, timeout performed, Correct Patient, Correct Site, Correct Laterality, Correct Procedure, Correct Position, site marked, Risks and benefits discussed, pre-op evaluation,  At surgeon's request and post-op pain management  Laterality: Right  Prep: Maximum Sterile Barrier Precautions used, chloraprep       Needles:  Injection technique: Single-shot  Needle Type: Echogenic Stimulator Needle     Needle Length: 5cm  Needle Gauge: 22     Additional Needles:   Procedures:,,,, ultrasound used (permanent image in chart),,,,  Narrative:  Start time: 03/03/2019 6:56 AM End time: 03/03/2019 7:06 AM Injection made incrementally with aspirations every 5 mL. Anesthesiologist: Roderic Palau, MD  Additional Notes: 2% Lidocaine skin wheel.

## 2019-03-03 NOTE — Op Note (Signed)
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  Briefly, he has history of epilepsy and sustained a recurrent right shoulder posterior dislocation.  This was locked and unable to be reduced via conscious sedation by my partner.  He presents today for open reduction and hemiarthroplasty due to large reverse Hill-Sachs lesion.  We discussed in the office the need to move forward with this procedure in a somewhat urgent fashion given the persistence of the dislocation.  He has no neurologic deficits going into surgery.;  The patient did consent to the procedure after discussion of the risks and benefits.  PREOPERATIVE DIAGNOSIS:  1.  Right shoulder posterior instability, chronic. 2.  Right shoulder reverse Hill-Sachs lesion 3.  Right shoulder locked posterior dislocation  POSTOPERATIVE DIAGNOSIS: Same.  PROCEDURE:  1.  Right shoulder transfer of long head of biceps to pectoralis major 2.  Right shoulder hemiarthroplasty.  SURGEON: Geralynn Rile, M.D.  ASSIST: Katy Apo, RNFA.  ANESTHESIA:  general, and regional with interscalene  IV FLUIDS AND URINE: See anesthesia.  ESTIMATED BLOOD LOSS: 50 mL.  IMPLANTS:  Arthrex Eclipse hemiarthroplasty 43 x 19 head Arthrex double row suture anchor repair kit for subscapularis  DRAINS: None  COMPLICATIONS: None.  DESCRIPTION OF PROCEDURE: After obtaining informed consent,The patient was brought to the operating room and placed supine on the operating table.  The patient had been signed prior to the procedure and this was documented. The patient had the anesthesia placed by the anesthesiologist.  A time-out was performed to confirm that this was the correct patient, site, side and location. The patient did receive antibiotics prior to the incision and was re-dosed during the procedure as needed at indicated intervals.   The patient had the  operative extremity prepped and draped in the standard surgical fashion.   The head and neck were nicely stabilized.   A 10 blade was used to make a standard deltopectoral approach to the shoulder.  Dissection was carried out through the subcutaneous tissue to where the deltopectoral interval was visualized and developed.  The cephalic vein was mobilized and retracted medially for the duration of the case.  The subacromial space was cleared bluntly retractors were placed appropriately deep to the pectoralis major tendon and deep to the deltoid muscle fascia.  The upper one third of the pectoralis muscle insertion on the humerus was sharply released.  There was found to be a small rent in the supraspinatus but otherwise the rotator cuff tendons were intact.  Next I performed a subscapularis takedown and a peel fashion.  Subscapularis, rotator interval, and the inferior capsule were all released.  At this juncture the humeral head was noted to still be engaged on the posterior aspect of the glenoid.  We were able to lever the humeral head off of the glenoid with a Cobb elevator.  This delivered the humeral head into the glenoid.  Next with external rotation we were able to complete the subscapularis peel.  This demonstrated a large anterior Hill-Sachs lesion of approximately 30% of the humeral head.  Next we made an anatomic neck cut preserving the rotator cuff attachment superiorly and posteriorly.  Next we prepared the humerus.  The stem was baseplate was prepared and found to be best suited for the 43 size.  This was impacted into place.  Next we determined that a medium cage screw was the appropriate length.  We then prepared for the  final implants.  We impacted the baseplate in place and the final cage screw.  Next we began to evaluate the posterior glenoid.  There was no articular erosion of the glenoid.  He had a posterior inferior labral tear that was completely peeled off of the glenoid face.  There  was just minimal posterior glenoid bone loss.  We then trialed a humeral head and found that they had centered nicely and did not dislocate posteriorly.  Therefore, we did not place any posterior labrum repair devices.  We felt that the 43 size humeral head with 19 mm buildup at the best 50% pushback and inferior translocation with good bounce back.  Next we impacted the final head implant into place.  This was trialed and felt to be stable.  Lastly we turned our attention to the subscapularis repair.  Utilizing the double row suture anchor technique, we inserted the 2 Medial Row anchors in the lesser tuberosity.  These were then passed with 2 suture tapes through the medial aspect of the tendon.  Next the lateral row sutures were prepared utilizing the tagging sutures previously placed.  2 lateral row swivel lock anchors were placed in the biceps groove.  This had excellent tension.  We then tied a medial compression row with the remaining medial row sutures that had previously been passed.  The superior lateral corner of the subscapularis was closed against the supraspinatus with #2 FiberWire.  Following completion of the repair all tails of sutures were cut.  The subscapularis had excellent compression and was without gapping on passive range of motion.  The wound was then copiously irrigated.  A gram of vancomycin powder was placed in the deltopectoral interval.  Subcutaneous tissue was closed with 2-0 Vicryl.  Skin was closed with a running subcuticular 4-0 Monocryl.  Steri-Strips and a sterile waterproof bandage was applied.  The arm was placed in a sling.  The patient tolerated the procedure well.  All counts were correct 2.  There were no intraoperative complications.  The patient was transferred to PACU in stable condition.   Disposition: The patient will be nonweightbearing to the operative extremity for proximally 4-6 weeks.  He will begin physical therapy in the outpatient setting.  He  will be admitted for routine postoperative care including antibiotics, pain control, and DVT prophylaxis.  Sling will be maintained at all times.  Return for wound check in approximately 2 weeks and then again at 6 weeks.  POSTOPERATIVE PLAN:  He will be admitted for overnight observation.  Nonweightbearing and sling to the operative extremity.  Discharge home tomorrow.

## 2019-03-04 ENCOUNTER — Encounter (HOSPITAL_COMMUNITY): Payer: Self-pay | Admitting: Orthopedic Surgery

## 2019-03-04 LAB — GLUCOSE, CAPILLARY: Glucose-Capillary: 141 mg/dL — ABNORMAL HIGH (ref 70–99)

## 2019-03-04 MED ORDER — OXYCODONE-ACETAMINOPHEN 5-325 MG PO TABS: 1.0000 | ORAL_TABLET | Freq: Four times a day (QID) | ORAL | 0 refills | Status: DC | PRN

## 2019-03-04 MED ORDER — ONDANSETRON 4 MG PO TBDP: 4.0000 mg | ORAL_TABLET | Freq: Three times a day (TID) | ORAL | 0 refills | Status: DC | PRN

## 2019-03-04 NOTE — Evaluation (Signed)
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  Briefly, he has history of epilepsy and sustained a recurrent right shoulder posterior dislocation. Pt is now s/p Right shoulder transfer of long head of biceps to pectoralis major and Right shoulder hemiarthroplasty..   Clinical Impression   Pt admitted with the above diagnoses and presents with below problem list. Pt will benefit from continued acute OT to address the below listed deficits and maximize independence with basic ADLs prior to d/c home. At baseline, pt is independent with ADLs. Pt is currently min A with most ADLs, close min guard to min A with functional mobility in the room. ADL, shoulder precautions, and sling education discussed with pt and relevant educational handouts provided. Recommend PT consult prior to d/c home.     Follow Up Recommendations  No OT follow up;Supervision - Intermittent(OOB/mobility)    Equipment Recommendations  3 in 1 bedside commode    Recommendations for Other Services PT consult     Precautions / Restrictions Precautions Precautions: Shoulder Shoulder Interventions: Shoulder sling/immobilizer;At all times;Off for dressing/bathing/exercises Precaution Booklet Issued: Yes (comment) Required Braces or Orthoses: Sling Restrictions Weight Bearing Restrictions: No      Mobility Bed Mobility Overal bed mobility: Needs Assistance Bed Mobility: Supine to Sit;Sit to Supine     Supine to sit: Min assist;HOB elevated Sit to supine: Min guard   General bed mobility comments: Pt reaching for therapist arm to pull up on to power up trunk to EOB position. min guard for safety returning to supine  Transfers Overall transfer level: Needs assistance Equipment used: None Transfers: Sit to/from Stand Sit  to Stand: Min guard         General transfer comment: min guard for safety. to/from EOB    Balance Overall balance assessment: History of Falls;Needs assistance         Standing balance support: No upper extremity supported;Single extremity supported Standing balance-Leahy Scale: Fair                             ADL either performed or assessed with clinical judgement   ADL Overall ADL's : Needs assistance/impaired Eating/Feeding: Set up;Sitting   Grooming: Minimal assistance;Sitting   Upper Body Bathing: Minimal assistance;Sitting   Lower Body Bathing: Minimal assistance;Min guard;Sit to/from stand   Upper Body Dressing : Minimal assistance;Sitting   Lower Body Dressing: Minimal assistance;Min guard;Sit to/from stand   Toilet Transfer: Min guard;Minimal assistance;Ambulation   Toileting- Clothing Manipulation and Hygiene: Min guard;Minimal assistance   Tub/ Shower Transfer: Minimal assistance;Tub transfer;Ambulation   Functional mobility during ADLs: Min guard;Minimal assistance General ADL Comments: Shoulder ADL education and precautions discussed. Pt walked around in the room at mostly min guard level, light steadying assist on turn.     Vision         Perception     Praxis      Pertinent Vitals/Pain Pain Assessment: Faces Faces Pain Scale: Hurts even more Pain Location: RUE Pain Descriptors / Indicators: Grimacing;Sore Pain Intervention(s): Limited activity within patient's tolerance;Monitored during session;Premedicated before session;Repositioned;Ice applied     Hand Dominance     Extremity/Trunk Assessment Upper Extremity Assessment Upper Extremity Assessment: LUE deficits/detail;RUE deficits/detail RUE Deficits / Details: anticipated deficits s/p R shoulder surgery RUE: Unable to fully assess due to immobilization;Unable to fully assess  due to pain LUE Deficits / Details: h/o shoulder dislocations, limited shoulder level ROM    Lower Extremity Assessment Lower Extremity Assessment: Defer to PT evaluation       Communication Communication Communication: No difficulties   Cognition Arousal/Alertness: Awake/alert Behavior During Therapy: WFL for tasks assessed/performed Overall Cognitive Status: Within Functional Limits for tasks assessed                                     General Comments       Exercises     Shoulder Instructions      Home Living Family/patient expects to be discharged to:: Private residence   Available Help at Discharge: Family;Available PRN/intermittently Type of Home: House Home Access: Stairs to enter [employer] of Steps: 2   Home Layout: One level     Bathroom Shower/Tub: Tub/shower unit                    Prior Functioning/Environment Level of Independence: Independent        Comments: h/o seizure-related falls        OT Problem List: Impaired balance (sitting and/or standing);Decreased knowledge of use of DME or AE;Decreased knowledge of precautions;Pain;Impaired UE functional use      OT Treatment/Interventions: Self-care/ADL training;DME and/or AE instruction;Therapeutic activities;Patient/family education;Balance training;Therapeutic exercise    OT Goals(Current goals can be found in the care plan section) Acute Rehab OT Goals Patient Stated Goal: feel/move better before returning home OT Goal Formulation: With patient Time For Goal Achievement: 03/11/19 Potential to Achieve Goals: Good ADL Goals Pt Will Perform Upper Body Dressing: with modified independence;sitting Pt Will Perform Lower Body Dressing: with modified independence;sit to/from stand Pt Will Transfer to Toilet: with modified independence;ambulating Pt Will Perform Toileting - Clothing Manipulation and hygiene: with modified independence;sit to/from stand Pt Will Perform Tub/Shower Transfer: with supervision;ambulating Pt/caregiver will Perform Home  Exercise Program: Independently;With written HEP provided  OT Frequency: Min 2X/week   Barriers to D/C:            Co-evaluation              AM-PAC OT "6 Clicks" Daily Activity     Outcome Measure Help from another person eating meals?: None Help from another person taking care of personal grooming?: A Little Help from another person toileting, which includes using toliet, bedpan, or urinal?: A Little Help from another person bathing (including washing, rinsing, drying)?: A Little Help from another person to put on and taking off regular upper body clothing?: A Little Help from another person to put on and taking off regular lower body clothing?: A Little 6 Click Score: 19   End of Session Equipment Utilized During Treatment: Other (comment)(sling) Nurse Communication: Mobility status;Other (comment)(swimmy-headed OOB; bp assessed and WNL)  Activity Tolerance: Other (comment)(some swimmy-headed sensation reported while OOB) Patient left: in bed;with call bell/phone within reach  OT Visit Diagnosis: Unsteadiness on feet (R26.81);Pain;History of falling (Z91.81) Pain - Right/Left: Right Pain - part of body: Shoulder                Time: 4037-0964 OT Time Calculation (min): 30 min Charges:  OT General Charges $OT Visit: 1 Visit OT Evaluation $OT Eval Low Complexity: 1 Low OT Treatments $Self Care/Home Management : 8-22 mins  Raynald Kemp, OT Acute Rehabilitation Services Pager: [phone] Office: [phone]   Pilar Grammes 03/04/2019, 10:11 AM

## 2019-03-04 NOTE — Progress Notes (Signed)
   Subjective:  Patient reports pain as mild.  Doing well  Objective:   VITALS:   Vitals:   03/03/19 2328 03/04/19 0419 03/04/19 0750 03/04/19 0927  BP: 113/75 112/76 120/79 124/89  Pulse: 99 86 98 98  Resp: 19 17 17    Temp: 98 F (36.7 C) 98.5 F (36.9 C) (!) 97.3 F (36.3 C)   TempSrc:  Oral Oral   SpO2: 99% 99% 99% 96%  Weight:      Height:        Neurologically intact Neurovascular intact Sensation intact distally Incision: dressing C/D/I   Lab Results  Component Value Date   WBC 10.1 03/03/2019   HGB 14.5 03/03/2019   HCT 42.6 03/03/2019   MCV 88.6 03/03/2019   PLT 348 03/03/2019   BMET    Component Value Date/Time   NA 134 (L) 03/03/2019 0646   K 4.0 03/03/2019 0646   CL 98 03/03/2019 0646   CO2 23 03/03/2019 0646   GLUCOSE 166 (H) 03/03/2019 0646   BUN 12 03/03/2019 0646   CREATININE 0.87 03/03/2019 0646   CALCIUM 9.3 03/03/2019 0646   GFRNONAA >60 03/03/2019 0646   GFRAA >60 03/03/2019 0646     Assessment/Plan: 1 Day Post-Op   Principal Problem:   Posterior dislocation of right shoulder joint Active Problems:   S/P shoulder replacement, right   Up with therapy Dc hjome today Follow up in 2 weeks    Nicholes Stairs 03/04/2019, 12:17 PM   Geralynn Rile, MD [phone]

## 2019-03-04 NOTE — Progress Notes (Signed)
13:57 Patient discharged. AVS, medications, wound care, and follow-up appointments reviewed. Patient had no further questions.   Patient was transported in wheelchair by Nurse Tech to his ride's vehicle

## 2019-03-04 NOTE — Plan of Care (Signed)
°  Problem: Education: °Goal: Knowledge of the prescribed therapeutic regimen will improve °Outcome: Adequate for Discharge °Goal: Understanding of activity limitations/precautions following surgery will improve °Outcome: Adequate for Discharge °Goal: Individualized Educational Video(s) °Outcome: Adequate for Discharge °  °Problem: Activity: °Goal: Ability to tolerate increased activity will improve °Outcome: Adequate for Discharge °  °Problem: Pain Management: °Goal: Pain level will decrease with appropriate interventions °Outcome: Adequate for Discharge °  °

## 2019-03-04 NOTE — Progress Notes (Signed)
Pt VS stable. Pain managed with Vicodin. Will continue to monitor.

## 2019-03-04 NOTE — Evaluation (Signed)
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  Briefly, he has history of epilepsy and sustained a recurrent right shoulder posterior dislocation. Pt is now s/p Right shoulder transfer of long head of biceps to pectoralis major and Right shoulder hemiarthroplasty..  Clinical Impression  Pt admitted s/p procedure. Pt presents with decreased functional mobility secondary to expected post surgical pain, right arm weakness and decreased ROM, and mild dynamic balance impairments. Pt ambulating hallway distances without an assistive device with slow speed but no overt loss of balance. Negotiated 10 steps with no rail to simulate home set up. No further acute PT needs. PT signing off.      Follow Up Recommendations Outpatient PT    Equipment Recommendations  None recommended by PT    Recommendations for Other Services       Precautions / Restrictions Precautions Precautions: Shoulder Shoulder Interventions: Shoulder sling/immobilizer;At all times;Off for dressing/bathing/exercises Precaution Booklet Issued: Yes (comment) Required Braces or Orthoses: Sling Restrictions Weight Bearing Restrictions: No      Mobility  Bed Mobility Overal bed mobility: Needs Assistance Bed Mobility: Supine to Sit;Sit to Supine     Supine to sit: Min assist;HOB elevated Sit to supine: Modified independent (Device/Increase time)   General bed mobility comments: Pt pulling up on therapist arm, exiting on right side of bed.  Transfers Overall transfer level: Modified independent Equipment used: None Transfers: Sit to/from Stand Sit to Stand: Min guard         General transfer comment: min guard for safety. to/from EOB  Ambulation/Gait Ambulation/Gait assistance: Supervision Gait Distance (Feet): 150  Feet Assistive device: None Gait Pattern/deviations: Step-through pattern;Decreased stride length     General Gait Details: Cues for increased foot clearance, left arm reciprocal swing, and faster gait speed. Pt able to correct somewhat.  Stairs Stairs: Yes Stairs assistance: Supervision Stair Management: No rails Number of Stairs: 10 General stair comments: cues for step by step pattern for safety  Wheelchair Mobility    Modified Rankin (Stroke Patients Only)       Balance Overall balance assessment: History of Falls;Needs assistance         Standing balance support: No upper extremity supported;Single extremity supported Standing balance-Leahy Scale: Good                               Pertinent Vitals/Pain Pain Assessment: Faces Faces Pain Scale: Hurts even more Pain Location: RUE Pain Descriptors / Indicators: Grimacing;Sore Pain Intervention(s): Limited activity within patient's tolerance;Monitored during session;Premedicated before session;Repositioned;Ice applied    Home Living Family/patient expects to be discharged to:: Private residence   Available Help at Discharge: Family;Available PRN/intermittently Type of Home: House Home Access: Stairs to enter   [employer] of Steps: 2 Home Layout: One level        Prior Function Level of Independence: Independent         Comments: h/o seizure-related falls     Hand Dominance        Extremity/Trunk Assessment   Upper Extremity Assessment Upper Extremity Assessment: LUE deficits/detail RUE Deficits / Details: anticipated deficits s/p R shoulder surgery RUE: Unable to fully assess due to immobilization;Unable to fully assess due to pain LUE Deficits / Details: h/o shoulder dislocations, limited shoulder level ROM    Lower Extremity Assessment Lower  Extremity Assessment: Overall WFL for tasks assessed    Cervical / Trunk Assessment Cervical / Trunk Assessment: Normal   Communication   Communication: No difficulties  Cognition Arousal/Alertness: Awake/alert Behavior During Therapy: WFL for tasks assessed/performed Overall Cognitive Status: Within Functional Limits for tasks assessed                                        General Comments      Exercises     Assessment/Plan    PT Assessment Patent does not need any further PT services  PT Problem List Decreased strength;Decreased range of motion;Decreased balance;Pain       PT Treatment Interventions      PT Goals (Current goals can be found in the Care Plan section)  Acute Rehab PT Goals Patient Stated Goal: feel/move better before returning home PT Goal Formulation: All assessment and education complete, DC therapy    Frequency     Barriers to discharge        Co-evaluation               AM-PAC PT "6 Clicks" Mobility  Outcome Measure Help needed turning from your back to your side while in a flat bed without using bedrails?: None Help needed moving from lying on your back to sitting on the side of a flat bed without using bedrails?: A Little Help needed moving to and from a bed to a chair (including a wheelchair)?: None Help needed standing up from a chair using your arms (e.g., wheelchair or bedside chair)?: None Help needed to walk in hospital room?: None Help needed climbing 3-5 steps with a railing? : None 6 Click Score: 23    End of Session Equipment Utilized During Treatment: Gait belt Activity Tolerance: Patient tolerated treatment well Patient left: in bed;with call bell/phone within reach Nurse Communication: Mobility status PT Visit Diagnosis: Unsteadiness on feet (R26.81);Pain Pain - Right/Left: Right Pain - part of body: Shoulder    Time: 8099-8338 PT Time Calculation (min) (ACUTE ONLY): 21 min   Charges:   PT Evaluation $PT Eval Low Complexity: 1 Low          Laurina Bustle, PT, DPT Acute Rehabilitation Services Pager  [phone] Office [phone]   Vanetta Mulders 03/04/2019, 12:33 PM

## 2019-03-04 NOTE — Plan of Care (Signed)
  Problem: Education: Goal: Knowledge of the prescribed therapeutic regimen will improve Outcome: Progressing   Problem: Activity: Goal: Ability to tolerate increased activity will improve Outcome: Progressing   Problem: Pain Management: Goal: Pain level will decrease with appropriate interventions Outcome: Progressing   

## 2019-03-08 NOTE — Discharge Summary (Signed)
Patient ID: Alan Alvarez MRN: [id number] DOB/AGE: [birth year] 37 y.o.  Admit date: 03/03/2019 Discharge date: 03/04/2019  Primary Diagnosis: Right chronic shoulder posterior dislocation  Admission Diagnoses:  Past Medical History:  Diagnosis Date   Diabetes mellitus without complication ([place])    type 2   History of seizure    Hyperlipidemia    Hypertension    Discharge Diagnoses:   Principal Problem:   Posterior dislocation of right shoulder joint Active Problems:   S/P shoulder replacement, right  Estimated body mass index is 29.05 kg/m as calculated from the following:   Height as of this encounter: '5\' 6"'  (1.676 m).   Weight as of this encounter: 81.6 kg.  Procedure:  Procedure(s) (LRB): SHOULDER HEMI-ARTHROPLASTY (Right)   Consults: None  HPI: Alan Alvarez is a 38 year old male who has history of epilepsy.  He has had multiple posterior shoulder dislocations.  He presents today for hemiarthroplasty for large anterior Hill-Sachs deformity of the right shoulder.  He also has a irreducible locked posterior dislocation. Laboratory Data: Admission on 03/03/2019, Discharged on 03/04/2019  Component Date Value Ref Range Status   Sodium 03/03/2019 134* 135 - 145 mmol/L Final   Potassium 03/03/2019 4.0  3.5 - 5.1 mmol/L Final   Chloride 03/03/2019 98  98 - 111 mmol/L Final   CO2 03/03/2019 23  22 - 32 mmol/L Final   Glucose, Bld 03/03/2019 166* 70 - 99 mg/dL Final   BUN 03/03/2019 12  6 - 20 mg/dL Final   Creatinine, Ser 03/03/2019 0.87  0.61 - 1.24 mg/dL Final   Calcium 03/03/2019 9.3  8.9 - 10.3 mg/dL Final   Total Protein 03/03/2019 7.2  6.5 - 8.1 g/dL Final   Albumin 03/03/2019 4.3  3.5 - 5.0 g/dL Final   AST 03/03/2019 23  15 - 41 U/L Final   ALT 03/03/2019 33  0 - 44 U/L Final   Alkaline Phosphatase 03/03/2019 72  38 - 126 U/L Final   Total Bilirubin 03/03/2019 0.7  0.3 - 1.2 mg/dL Final   GFR calc non Af Amer 03/03/2019 >60  >60 mL/min Final   GFR calc  Af Amer 03/03/2019 >60  >60 mL/min Final   Anion gap 03/03/2019 13  5 - 15 Final   Performed at [facility], [place] [address], [place], [place] [address]   WBC 03/03/2019 10.1  4.0 - 10.5 K/uL Final   RBC 03/03/2019 4.81  4.22 - 5.81 MIL/uL Final   Hemoglobin 03/03/2019 14.5  13.0 - 17.0 g/dL Final   HCT 03/03/2019 42.6  39.0 - 52.0 % Final   MCV 03/03/2019 88.6  80.0 - 100.0 fL Final   MCH 03/03/2019 30.1  26.0 - 34.0 pg Final   MCHC 03/03/2019 34.0  30.0 - 36.0 g/dL Final   RDW 03/03/2019 12.1  11.5 - 15.5 % Final   Platelets 03/03/2019 348  150 - 400 K/uL Final   nRBC 03/03/2019 0.0  0.0 - 0.2 % Final   Performed at [facility] [address], [place], [place] [address]   Glucose-Capillary 03/03/2019 137* 70 - 99 mg/dL Final   Glucose-Capillary 03/03/2019 164* 70 - 99 mg/dL Final   Glucose-Capillary 03/03/2019 145* 70 - 99 mg/dL Final   Glucose-Capillary 03/03/2019 203* 70 - 99 mg/dL Final   Glucose-Capillary 03/04/2019 141* 70 - 99 mg/dL Final  Hospital Outpatient Visit on 03/01/2019  Component Date Value Ref Range Status   SARS Coronavirus 2 03/01/2019 NEGATIVE  NEGATIVE Final   Comment: (NOTE)  SARS-CoV-2 target nucleic acids are NOT DETECTED. The SARS-CoV-2 RNA is generally detectable in upper and lower respiratory specimens during the acute phase of infection. Negative results do not preclude SARS-CoV-2 infection, do not rule out co-infections with other pathogens, and should not be used as the sole basis for treatment or other patient management decisions. Negative results must be combined with clinical observations, patient history, and epidemiological information. The expected result is Negative. Fact Sheet for Patients: SugarRoll.be Fact Sheet for Healthcare Providers: https://www.woods-mathews.com/ This test is not yet approved or cleared by the [place] FDA and  has been authorized for  detection and/or diagnosis of SARS-CoV-2 by FDA under an Emergency Use Authorization (EUA). This EUA will remain  in effect (meaning this test can be used) for the duration of the COVID-19 declaration under Section 56                          4(b)(1) of the Act, 21 U.S.C. section 360bbb-3(b)(1), unless the authorization is terminated or revoked sooner. Performed at [facility], [place] [address], [place], [place] [address]   Admission on 02/12/2019, Discharged on 02/13/2019  Component Date Value Ref Range Status   Glucose-Capillary 02/12/2019 184* 70 - 99 mg/dL Final   Sodium 02/12/2019 135  135 - 145 mmol/L Final   Potassium 02/12/2019 4.2  3.5 - 5.1 mmol/L Final   Chloride 02/12/2019 98  98 - 111 mmol/L Final   CO2 02/12/2019 22  22 - 32 mmol/L Final   Glucose, Bld 02/12/2019 207* 70 - 99 mg/dL Final   BUN 02/12/2019 17  6 - 20 mg/dL Final   Creatinine, Ser 02/12/2019 1.00  0.61 - 1.24 mg/dL Final   Calcium 02/12/2019 9.8  8.9 - 10.3 mg/dL Final   Total Protein 02/12/2019 8.0  6.5 - 8.1 g/dL Final   Albumin 02/12/2019 4.9  3.5 - 5.0 g/dL Final   AST 02/12/2019 25  15 - 41 U/L Final   ALT 02/12/2019 27  0 - 44 U/L Final   Alkaline Phosphatase 02/12/2019 78  38 - 126 U/L Final   Total Bilirubin 02/12/2019 0.8  0.3 - 1.2 mg/dL Final   GFR calc non Af Amer 02/12/2019 >60  >60 mL/min Final   GFR calc Af Amer 02/12/2019 >60  >60 mL/min Final   Anion gap 02/12/2019 15  5 - 15 Final   Performed at [facility], [place] [address], [place], [place] [address]   Alcohol, Ethyl (B) 02/12/2019 <10  <10 mg/dL Final   Comment: (NOTE) Lowest detectable limit for serum alcohol is 10 mg/dL. For medical purposes only. Performed at [facility], [place] [address] [place], [place] [address]    WBC 02/12/2019 14.7* 4.0 - 10.5 K/uL Final   RBC 02/12/2019 5.09  4.22 - 5.81 MIL/uL Final   Hemoglobin 02/12/2019 15.3  13.0 - 17.0 g/dL Final    HCT 02/12/2019 44.4  39.0 - 52.0 % Final   MCV 02/12/2019 87.2  80.0 - 100.0 fL Final   MCH 02/12/2019 30.1  26.0 - 34.0 pg Final   MCHC 02/12/2019 34.5  30.0 - 36.0 g/dL Final   RDW 02/12/2019 11.7  11.5 - 15.5 % Final   Platelets 02/12/2019 371  150 - 400 K/uL Final   nRBC 02/12/2019 0.0  0.0 - 0.2 % Final   Neutrophils Relative % 02/12/2019 50  % Final   Neutro Abs 02/12/2019 7.4  1.7 -  7.7 K/uL Final   Lymphocytes Relative 02/12/2019 42  % Final   Lymphs Abs 02/12/2019 6.1* 0.7 - 4.0 K/uL Final   Monocytes Relative 02/12/2019 6  % Final   Monocytes Absolute 02/12/2019 0.8  0.1 - 1.0 K/uL Final   Eosinophils Relative 02/12/2019 2  % Final   Eosinophils Absolute 02/12/2019 0.2  0.0 - 0.5 K/uL Final   Basophils Relative 02/12/2019 0  % Final   Basophils Absolute 02/12/2019 0.0  0.0 - 0.1 K/uL Final   Immature Granulocytes 02/12/2019 0  % Final   Abs Immature Granulocytes 02/12/2019 0.05  0.00 - 0.07 K/uL Final   Performed at [facility], [place] [address], [place], [place] [address]  Admission on 01/25/2019, Discharged on 01/25/2019  Component Date Value Ref Range Status   Glucose-Capillary 01/25/2019 206* 70 - 99 mg/dL Final   Comment 1 01/25/2019 Notify RN   Final   Comment 2 01/25/2019 Document in Chart   Final   Glucose-Capillary 01/25/2019 213* 70 - 99 mg/dL Final  Admission on 01/13/2019, Discharged on 01/16/2019  Component Date Value Ref Range Status   Sodium 01/13/2019 132* 135 - 145 mmol/L Final   Potassium 01/13/2019 4.0  3.5 - 5.1 mmol/L Final   Chloride 01/13/2019 97* 98 - 111 mmol/L Final   CO2 01/13/2019 19* 22 - 32 mmol/L Final   Glucose, Bld 01/13/2019 385* 70 - 99 mg/dL Final   BUN 01/13/2019 14  6 - 20 mg/dL Final   Creatinine, Ser 01/13/2019 1.01  0.61 - 1.24 mg/dL Final   Calcium 01/13/2019 9.8  8.9 - 10.3 mg/dL Final   GFR calc non Af Amer 01/13/2019 >60  >60 mL/min Final   GFR calc Af Amer 01/13/2019 >60  >60  mL/min Final   Anion gap 01/13/2019 16* 5 - 15 Final   Performed at [facility], [place] [address] [place], [place] [address]   WBC 01/13/2019 14.5* 4.0 - 10.5 K/uL Final   RBC 01/13/2019 5.55  4.22 - 5.81 MIL/uL Final   Hemoglobin 01/13/2019 17.3* 13.0 - 17.0 g/dL Final   HCT 01/13/2019 48.8  39.0 - 52.0 % Final   MCV 01/13/2019 87.9  80.0 - 100.0 fL Final   MCH 01/13/2019 31.2  26.0 - 34.0 pg Final   MCHC 01/13/2019 35.5  30.0 - 36.0 g/dL Final   RDW 01/13/2019 11.8  11.5 - 15.5 % Final   Platelets 01/13/2019 364  150 - 400 K/uL Final   nRBC 01/13/2019 0.0  0.0 - 0.2 % Final   Performed at [facility] [address] [place], [place] [address]   Color, Urine 01/13/2019 STRAW* YELLOW Final   APPearance 01/13/2019 CLEAR  CLEAR Final   Specific Gravity, Urine 01/13/2019 1.020  1.005 - 1.030 Final   pH 01/13/2019 5.0  5.0 - 8.0 Final   Glucose, UA 01/13/2019 >=500* NEGATIVE mg/dL Final   Hgb urine dipstick 01/13/2019 SMALL* NEGATIVE Final   Bilirubin Urine 01/13/2019 NEGATIVE  NEGATIVE Final   Ketones, ur 01/13/2019 20* NEGATIVE mg/dL Final   Protein, ur 01/13/2019 30* NEGATIVE mg/dL Final   Nitrite 01/13/2019 NEGATIVE  NEGATIVE Final   Leukocytes,Ua 01/13/2019 NEGATIVE  NEGATIVE Final   RBC / HPF 01/13/2019 0-5  0 - 5 RBC/hpf Final   Bacteria, UA 01/13/2019 RARE* NONE SEEN Final   Performed at [facility], [place] [address], [place], [place] [address]   Glucose-Capillary 01/13/2019 334* 70 - 99 mg/dL Final  pH, Ven 01/13/2019 7.367  7.250 - 7.430 Final   pCO2, Ven 01/13/2019 39.8* 44.0 - 60.0 mmHg Final   pO2, Ven 01/13/2019 33.0  32.0 - 45.0 mmHg Final   Bicarbonate 01/13/2019 22.8  20.0 - 28.0 mmol/L Final   TCO2 01/13/2019 24  22 - 32 mmol/L Final   O2 Saturation 01/13/2019 62.0  % Final   Acid-base deficit 01/13/2019 2.0  0.0 - 2.0 mmol/L Final   Sodium 01/13/2019 134* 135 - 145 mmol/L Final   Potassium 01/13/2019 4.4  3.5 -  5.1 mmol/L Final   Calcium, Ion 01/13/2019 1.11* 1.15 - 1.40 mmol/L Final   HCT 01/13/2019 52.0  39.0 - 52.0 % Final   Hemoglobin 01/13/2019 17.7* 13.0 - 17.0 g/dL Final   Patient temperature 01/13/2019 HIDE   Final   Sample type 01/13/2019 VENOUS   Final   Comment 01/13/2019 NOTIFIED PHYSICIAN   Final   SARS Coronavirus 2 01/13/2019 NEGATIVE  NEGATIVE Final   Comment: (NOTE) If result is NEGATIVE SARS-CoV-2 target nucleic acids are NOT DETECTED. The SARS-CoV-2 RNA is generally detectable in upper and lower  respiratory specimens during the acute phase of infection. The lowest  concentration of SARS-CoV-2 viral copies this assay can detect is 250  copies / mL. A negative result does not preclude SARS-CoV-2 infection  and should not be used as the sole basis for treatment or other  patient management decisions.  A negative result may occur with  improper specimen collection / handling, submission of specimen other  than nasopharyngeal swab, presence of viral mutation(s) within the  areas targeted by this assay, and inadequate number of viral copies  (<250 copies / mL). A negative result must be combined with clinical  observations, patient history, and epidemiological information. If result is POSITIVE SARS-CoV-2 target nucleic acids are DETECTED. The SARS-CoV-2 RNA is generally detectable in upper and lower  respiratory specimens dur                          ing the acute phase of infection.  Positive  results are indicative of active infection with SARS-CoV-2.  Clinical  correlation with patient history and other diagnostic information is  necessary to determine patient infection status.  Positive results do  not rule out bacterial infection or co-infection with other viruses. If result is PRESUMPTIVE POSTIVE SARS-CoV-2 nucleic acids MAY BE PRESENT.   A presumptive positive result was obtained on the submitted specimen  and confirmed on repeat testing.  While 2019 novel  coronavirus  (SARS-CoV-2) nucleic acids may be present in the submitted sample  additional confirmatory testing may be necessary for epidemiological  and / or clinical management purposes  to differentiate between  SARS-CoV-2 and other Sarbecovirus currently known to infect humans.  If clinically indicated additional testing with an alternate test  methodology [phone]) is advised. The SARS-CoV-2 RNA is generally  detectable in upper and lower respiratory sp                          ecimens during the acute  phase of infection. The expected result is Negative. Fact Sheet for Patients:  StrictlyIdeas.no Fact Sheet for Healthcare Providers: BankingDealers.co.za This test is not yet approved or cleared by the [place] FDA and has been authorized for detection and/or diagnosis of SARS-CoV-2 by FDA under an Emergency Use Authorization (EUA).  This EUA will remain in effect (meaning this test can be  used) for the duration of the COVID-19 declaration under Section 564(b)(1) of the Act, 21 U.S.C. section 360bbb-3(b)(1), unless the authorization is terminated or revoked sooner. Performed at [facility], [place] [address], [place], [place] [address]    Glucose-Capillary 01/13/2019 336* 70 - 99 mg/dL Final   Glucose-Capillary 01/13/2019 336* 70 - 99 mg/dL Final   Weight 01/14/2019 2,617.3  oz Final   Height 01/14/2019 66  in Final   BP 01/14/2019 141/96  mmHg Final   Glucose-Capillary 01/14/2019 280* 70 - 99 mg/dL Final   Sodium 01/14/2019 137  135 - 145 mmol/L Final   Potassium 01/14/2019 3.9  3.5 - 5.1 mmol/L Final   Chloride 01/14/2019 100  98 - 111 mmol/L Final   CO2 01/14/2019 23  22 - 32 mmol/L Final   Glucose, Bld 01/14/2019 249* 70 - 99 mg/dL Final   BUN 01/14/2019 17  6 - 20 mg/dL Final   Creatinine, Ser 01/14/2019 0.99  0.61 - 1.24 mg/dL Final   Calcium 01/14/2019 9.7  8.9 - 10.3 mg/dL Final   GFR calc non Af  Amer 01/14/2019 >60  >60 mL/min Final   GFR calc Af Amer 01/14/2019 >60  >60 mL/min Final   Anion gap 01/14/2019 14  5 - 15 Final   Performed at [facility], [place] [address], [place], [place] [address]   Hgb A1c MFr Bld 01/14/2019 10.8* 4.8 - 5.6 % Final   Comment: (NOTE) Pre diabetes:          5.7%-6.4% Diabetes:              >6.4% Glycemic control for   <7.0% adults with diabetes    Mean Plasma Glucose 01/14/2019 263.26  mg/dL Final   Performed at [facility], [place] [address], [place], [place] [address]   HIV Screen 4th Generation wRfx 01/14/2019 Non Reactive  Non Reactive Final   Comment: (NOTE) Performed At: [facility] [place], [place] [id number] Rush Farmer MD IR:[id number]    Glucose-Capillary 01/14/2019 205* 70 - 99 mg/dL Final   Glucose-Capillary 01/14/2019 208* 70 - 99 mg/dL Final   Glucose-Capillary 01/14/2019 194* 70 - 99 mg/dL Final   Glucose-Capillary 01/14/2019 174* 70 - 99 mg/dL Final   Glucose-Capillary 01/14/2019 153* 70 - 99 mg/dL Final   Glucose-Capillary 01/14/2019 174* 70 - 99 mg/dL Final   Glucose-Capillary 01/14/2019 187* 70 - 99 mg/dL Final   Sodium 01/14/2019 137  135 - 145 mmol/L Final   Potassium 01/14/2019 3.5  3.5 - 5.1 mmol/L Final   Chloride 01/14/2019 101  98 - 111 mmol/L Final   CO2 01/14/2019 24  22 - 32 mmol/L Final   Glucose, Bld 01/14/2019 190* 70 - 99 mg/dL Final   BUN 01/14/2019 11  6 - 20 mg/dL Final   Creatinine, Ser 01/14/2019 0.83  0.61 - 1.24 mg/dL Final   Calcium 01/14/2019 9.3  8.9 - 10.3 mg/dL Final   GFR calc non Af Amer 01/14/2019 >60  >60 mL/min Final   GFR calc Af Amer 01/14/2019 >60  >60 mL/min Final   Anion gap 01/14/2019 12  5 - 15 Final   Performed at [facility], [place] [address] [place], [place] [address]   Glucose-Capillary 01/14/2019 233* 70 - 99 mg/dL Final   Glucose-Capillary 01/14/2019 216* 70 - 99 mg/dL Final   Sodium 01/15/2019 132* 135 - 145  mmol/L Final   Potassium 01/15/2019 3.6  3.5 - 5.1 mmol/L Final  Chloride 01/15/2019 96* 98 - 111 mmol/L Final   CO2 01/15/2019 24  22 - 32 mmol/L Final   Glucose, Bld 01/15/2019 224* 70 - 99 mg/dL Final   BUN 01/15/2019 9  6 - 20 mg/dL Final   Creatinine, Ser 01/15/2019 0.81  0.61 - 1.24 mg/dL Final   Calcium 01/15/2019 9.3  8.9 - 10.3 mg/dL Final   GFR calc non Af Amer 01/15/2019 >60  >60 mL/min Final   GFR calc Af Amer 01/15/2019 >60  >60 mL/min Final   Anion gap 01/15/2019 12  5 - 15 Final   Performed at [facility] [address], [place], [place] [address]   Glucose-Capillary 01/14/2019 194* 70 - 99 mg/dL Final   Glucose-Capillary 01/14/2019 252* 70 - 99 mg/dL Final   Glucose-Capillary 01/15/2019 210* 70 - 99 mg/dL Final   Magnesium 01/15/2019 2.1  1.7 - 2.4 mg/dL Final   Performed at [facility], [place] [address] [place], [place] [address]   Glucose-Capillary 01/15/2019 250* 70 - 99 mg/dL Final   Sodium 01/16/2019 133* 135 - 145 mmol/L Final   Potassium 01/16/2019 3.9  3.5 - 5.1 mmol/L Final   Chloride 01/16/2019 98  98 - 111 mmol/L Final   CO2 01/16/2019 23  22 - 32 mmol/L Final   Glucose, Bld 01/16/2019 215* 70 - 99 mg/dL Final   BUN 01/16/2019 13  6 - 20 mg/dL Final   Creatinine, Ser 01/16/2019 0.81  0.61 - 1.24 mg/dL Final   Calcium 01/16/2019 9.4  8.9 - 10.3 mg/dL Final   GFR calc non Af Amer 01/16/2019 >60  >60 mL/min Final   GFR calc Af Amer 01/16/2019 >60  >60 mL/min Final   Anion gap 01/16/2019 12  5 - 15 Final   Performed at [place] [address], [place], [place] [address]   WBC 01/16/2019 10.7* 4.0 - 10.5 K/uL Final   RBC 01/16/2019 5.40  4.22 - 5.81 MIL/uL Final   Hemoglobin 01/16/2019 16.3  13.0 - 17.0 g/dL Final   HCT 01/16/2019 47.9  39.0 - 52.0 % Final   MCV 01/16/2019 88.7  80.0 - 100.0 fL Final   MCH 01/16/2019 30.2  26.0 - 34.0 pg Final   MCHC 01/16/2019 34.0  30.0 - 36.0 g/dL Final   RDW  01/16/2019 11.8  11.5 - 15.5 % Final   Platelets 01/16/2019 305  150 - 400 K/uL Final   nRBC 01/16/2019 0.0  0.0 - 0.2 % Final   Neutrophils Relative % 01/16/2019 58  % Final   Neutro Abs 01/16/2019 6.2  1.7 - 7.7 K/uL Final   Lymphocytes Relative 01/16/2019 29  % Final   Lymphs Abs 01/16/2019 3.1  0.7 - 4.0 K/uL Final   Monocytes Relative 01/16/2019 11  % Final   Monocytes Absolute 01/16/2019 1.2* 0.1 - 1.0 K/uL Final   Eosinophils Relative 01/16/2019 1  % Final   Eosinophils Absolute 01/16/2019 0.1  0.0 - 0.5 K/uL Final   Basophils Relative 01/16/2019 0  % Final   Basophils Absolute 01/16/2019 0.0  0.0 - 0.1 K/uL Final   Immature Granulocytes 01/16/2019 1  % Final   Abs Immature Granulocytes 01/16/2019 0.05  0.00 - 0.07 K/uL Final   Performed at [facility], [place] [address], [place], [place] [address]   Glucose-Capillary 01/15/2019 204* 70 - 99 mg/dL Final   Glucose-Capillary 01/15/2019 233* 70 - 99 mg/dL Final   Glucose-Capillary 01/16/2019 226* 70 - 99  mg/dL Final   Glucose-Capillary 01/16/2019 278* 70 - 99 mg/dL Final     X-Rays:Dg Shoulder Right  Result Date: 02/28/2019 CLINICAL DATA:  Right shoulder pain, dislocation history. EXAM: RIGHT SHOULDER - 2+ VIEW COMPARISON:  Multiple recent prior exams. FINDINGS: Overlapping appearance of humeral head and glenoid suggests persistent posterior humeral dislocation and/or subluxation to to at least some extent in this patient with known reverse Hill-Sachs lesion. No new fracture or bone abnormality is demonstrated. IMPRESSION: Persistent posterior dislocation and/or subluxation of the humerus with known reverse Hill-Sachs lesion. Electronically Signed   By: Zetta Bills M.D.   On: 02/28/2019 19:08   Dg Shoulder Right  Result Date: 02/12/2019 CLINICAL DATA:  Fall, seizure, pain EXAM: RIGHT SHOULDER - 2+ VIEW COMPARISON:  01/25/2019, 01/13/2019 FINDINGS: Recurrent posterior dislocation of the right glenohumeral  joint with a reverse Hill-Sachs deformity of the humeral head. No obvious reverse bony Bankart fracture of the glenoid. The acromioclavicular joint space is preserved. Partially imaged right chest is unremarkable. IMPRESSION: Recurrent posterior dislocation of the right glenohumeral joint with a reverse Hill-Sachs deformity of the humeral head. No obvious reverse bony Bankart fracture of the glenoid. Electronically Signed   By: Eddie Candle M.D.   On: 02/12/2019 21:45   Ct Shoulder Right Wo Contrast  Result Date: 02/13/2019 CLINICAL DATA:  Fall, seizure pain EXAM: CT OF THE UPPER RIGHT EXTREMITY WITHOUT CONTRAST TECHNIQUE: Multidetector CT imaging of the upper right extremity was performed according to the standard protocol. COMPARISON:  February 12, 2019 FINDINGS: Bones/Joint/Cartilage There is a posterior dislocation of the humeral head with a large reverse Hill-Sachs deformity. Tiny osseous fragments are seen at the glenohumeral joint space and posterior to the glenoid. No definite Bankart lesion however is noted. No other fractures identified. A moderate glenohumeral joint effusion is seen. Ligaments Suboptimally assessed by CT. Muscles and Tendons The muscles surrounding the shoulder appear to be intact. No fatty atrophy or muscular tear. The visualized portions of the tendons appear to be intact. Soft tissues Mild overlying soft tissue swelling is seen. IMPRESSION: 1. Posterior dislocation of the humeral head with a large reverse Hill-Sachs deformity. Tiny ossific fragment seen within the glenohumeral joint space. 2. No osseous Bankart lesion. 3. No definite full-thickness tear of the rotator cuff. Electronically Signed   By: Prudencio Pair M.D.   On: 02/13/2019 03:42   Dg Shoulder Right Port  Result Date: 03/03/2019 CLINICAL DATA:  Right shoulder arthroplasty EXAM: PORTABLE RIGHT SHOULDER COMPARISON:  02/28/2019 right shoulder radiographs FINDINGS: Interval right shoulder hemiarthroplasty with  well-positioned right humeral head prosthesis. No evident dislocation at the glenohumeral joint on this single frontal view. No malalignment at the acromioclavicular joint. Expected soft tissue gas surrounding the right shoulder. No osseous fracture. No suspicious focal osseous lesions. IMPRESSION: Satisfactory immediate postoperative single frontal view appearance status post right shoulder hemiarthroplasty. When clinically feasible, consider scapular Y-view of the right shoulder to confirm proper alignment. Electronically Signed   By: Ilona Sorrel M.D.   On: 03/03/2019 10:26   Dg Shoulder Right Portable  Result Date: 02/12/2019 CLINICAL DATA:  Post reduction posterior shoulder dislocation EXAM: PORTABLE RIGHT SHOULDER COMPARISON:  Same-day radiograph FINDINGS: Scapular Y-view reveals some persistent posterior displacement of the humeral head suggesting a perched posterior dislocation with the reverse Hill-Sachs lesion. No reversed bony Bankart injury is identified. Acromioclavicular joint is unremarkable. Coracoclavicular interval is maintained. Included portion of the right lung and chest wall is free of abnormality. IMPRESSION: Persistent posterior displacement of the humeral,  likely a perched posterior dislocation with reverse Hill-Sachs deformity. Electronically Signed   By: Lovena Le M.D.   On: 02/12/2019 23:50    EKG: Orders placed or performed during the hospital encounter of 02/12/19   EKG 12-Lead   EKG 12-Lead   EKG 12-Lead   EKG 12-Lead     Hospital Course: Chang Tiggs is a 37 y.o. who was admitted to Hospital. They were brought to the operating room on 03/03/2019 and underwent Procedure(s): SHOULDER HEMI-ARTHROPLASTY.  Patient tolerated the procedure well and was later transferred to the recovery room and then to the orthopaedic floor for postoperative care.  They were given PO and IV analgesics for pain control following their surgery.  They were given 24 hours of postoperative  antibiotics of  Anti-infectives (From admission, onward)   Start     Dose/Rate Route Frequency Ordered Stop   03/03/19 1330  ceFAZolin (ANCEF) IVPB 1 g/50 mL premix     1 g 100 mL/hr over 30 Minutes Intravenous Every 6 hours 03/03/19 1132 03/04/19 0128   03/03/19 0901  vancomycin (VANCOCIN) powder  Status:  Discontinued       As needed 03/03/19 0902 03/03/19 0934   03/03/19 0600  ceFAZolin (ANCEF) IVPB 2g/100 mL premix     2 g 200 mL/hr over 30 Minutes Intravenous On call to O.R. 03/03/19 [id number] 03/03/19 0737     and started on DVT prophylaxis in the form of Aspirin.   .  By day one, the patient had progressed with therapy and meeting their goals.  Incision was healing well.  Patient was seen in rounds and was ready to go home.   Diet: Regular diet Activity:NWB Follow-up:in 2 weeks Disposition - Home Discharged Condition: good   Discharge Instructions    Call MD / Call 911   Complete by: As directed    If you experience chest pain or shortness of breath, CALL 911 and be transported to the hospital emergency room.  If you develope a fever above 101 F, pus (white drainage) or increased drainage or redness at the wound, or calf pain, call your surgeon's office.   Constipation Prevention   Complete by: As directed    Drink plenty of fluids.  Prune juice may be helpful.  You may use a stool softener, such as Colace (over the counter) 100 mg twice a day.  Use MiraLax (over the counter) for constipation as needed.   Diet - low sodium heart healthy   Complete by: As directed    Increase activity slowly as tolerated   Complete by: As directed      Allergies as of 03/04/2019   No Known Allergies     Medication List    TAKE these medications   aspirin EC 81 MG tablet Take 1 tablet (81 mg total) by mouth daily.   atorvastatin 20 MG tablet Commonly known as: Lipitor Take 1 tablet (20 mg total) by mouth daily.   blood glucose meter kit and supplies Kit Dispense based on patient and  insurance preference. Use up to four times daily as directed. (FOR ICD-9 250.00, 250.01).   cyclobenzaprine 5 MG tablet Commonly known as: FLEXERIL Take 1 tablet (5 mg total) by mouth 2 (two) times daily as needed for muscle spasms.   divalproex 250 MG DR tablet Commonly known as: Depakote Take 1 tablet (250 mg total) by mouth 3 (three) times daily.   ibuprofen 200 MG tablet Commonly known as: ADVIL Take 200 mg by mouth every  6 (six) hours as needed for moderate pain.   insulin glargine 100 UNIT/ML injection Commonly known as: LANTUS Inject 0.1 mLs (10 Units total) into the skin daily.   ketorolac 10 MG tablet Commonly known as: TORADOL Take 10 mg by mouth every 6 (six) hours as needed.   levETIRAcetam 500 MG tablet Commonly known as: Keppra Take 1 tablet (500 mg total) by mouth 2 (two) times daily.   lisinopril 10 MG tablet Commonly known as: ZESTRIL Take 1 tablet (10 mg total) by mouth daily.   metFORMIN 1000 MG tablet Commonly known as: GLUCOPHAGE Take 1 tablet (1,000 mg total) by mouth 2 (two) times daily with a meal.   naproxen 500 MG tablet Commonly known as: NAPROSYN Take 500 mg by mouth 2 (two) times daily with a meal.   ondansetron 4 MG disintegrating tablet Commonly known as: Zofran ODT Take 1 tablet (4 mg total) by mouth every 8 (eight) hours as needed.   oxyCODONE-acetaminophen 5-325 MG tablet Commonly known as: Percocet Take 1-2 tablets by mouth every 6 (six) hours as needed for severe pain (moderate or severe pain). What changed:   how much to take  when to take this  reasons to take this      Follow-up Information    Nicholes Stairs, MD In 2 weeks.   Specialty: Orthopedic Surgery Why: For wound re-check Contact information: [address] [place] [address] [phone]           Signed: Geralynn Rile, MD Orthopaedic Surgery 03/08/2019, 3:51 PM

## 2019-03-21 ENCOUNTER — Ambulatory Visit: Payer: Self-pay | Attending: Orthopedic Surgery | Admitting: Physical Therapy

## 2019-03-21 ENCOUNTER — Encounter: Payer: Self-pay | Admitting: Physical Therapy

## 2019-03-21 ENCOUNTER — Other Ambulatory Visit: Payer: Self-pay

## 2019-03-21 DIAGNOSIS — M25511 Pain in right shoulder: Secondary | ICD-10-CM | POA: Insufficient documentation

## 2019-03-21 DIAGNOSIS — M25611 Stiffness of right shoulder, not elsewhere classified: Secondary | ICD-10-CM | POA: Insufficient documentation

## 2019-03-21 DIAGNOSIS — M6281 Muscle weakness (generalized): Secondary | ICD-10-CM | POA: Insufficient documentation

## 2019-03-21 DIAGNOSIS — R600 Localized edema: Secondary | ICD-10-CM | POA: Insufficient documentation

## 2019-03-21 NOTE — Patient Instructions (Signed)
Access Code: [email or web address]  URL: [email or web address]  Date: 03/21/2019  Prepared by: Hilda Blades   Exercises Seated Shoulder Flexion Towel Slide at Table Top - 10 reps - 5 seconds hold - 1-2x daily Supine Shoulder External Rotation AAROM with Dowel - 10 reps - 5 seconds hold - 1-2x daily Standing Circular Shoulder Pendulum Supported with Arm Bent - 20 reps - 1-2x daily

## 2019-03-22 NOTE — Therapy (Signed)
[facility] [address] [place], [place], [address] Phone: [phone]   Fax:  [phone]  Physical Therapy Evaluation  Patient Details  Name: Alan Alvarez MRN: [id number] Date of Birth: [birth year] Referring Provider (PT): Yolonda Kida, MD   Encounter Date: 03/21/2019  PT End of Session - 03/21/19 1633    Visit Number  1    Number of Visits  12    Date for PT Re-Evaluation  05/02/19    Authorization Type  Self pay    PT Start Time  1640    PT Stop Time  1728    PT Time Calculation (min)  48 min    Activity Tolerance  Patient tolerated treatment well    Behavior During Therapy  [facility] for tasks assessed/performed       Past Medical History:  Diagnosis Date  . Diabetes mellitus without complication (HCC)    type 2  . History of seizure   . Hyperlipidemia   . Hypertension     Past Surgical History:  Procedure Laterality Date  . SHOULDER HEMI-ARTHROPLASTY Right 03/03/2019   Procedure: SHOULDER HEMI-ARTHROPLASTY;  Surgeon: Yolonda Kida, MD;  Location: [facility] OR;  Service: Orthopedics;  Laterality: Right;    There were no vitals filed for this visit.   Subjective Assessment - 03/21/19 1631    Subjective  Patient had right shoulder hemiarthroplasty on 03/03/2019 and he has been doing ok since the surgery. He has been doing some elbow and wrist exercises and has remained in the sling. His pain has been high recently, he notes that he feels like the weather causes his pain to worsen. He sleeps in a bed with an ice pack on.    Pertinent History  DM, seizures    Limitations  Lifting;House hold activities;Writing    How long can you sit comfortably?  No limitation    How long can you stand comfortably?  No limitation    How long can you walk comfortably?  No limitation    Diagnostic tests  X-ray, CT    Patient Stated Goals  Be able to use right shoulder overhead and for activity to return to previous level of  function.    Currently in Pain?  Yes    Pain Score  6     Pain Location  Shoulder    Pain Orientation  Right    Pain Descriptors / Indicators  Throbbing;Aching;Tightness    Pain Type  Surgical pain    Pain Onset  1 to 4 weeks ago    Pain Frequency  Constant    Aggravating Factors   Pain just comes on, most of the time if he remains in the same position it will make it worse, weather, sleeping    Pain Relieving Factors  Medication and ice    Effect of Pain on Daily Activities  Patient is unable to use the right arm    Multiple Pain Sites  No         [facility] PT Assessment - 03/22/19 0001      Assessment   Medical Diagnosis  s/p right shoulder hemiarthroplasty    Referring Provider (PT)  Yolonda Kida, MD    Onset Date/Surgical Date  03/03/19    Hand Dominance  Right;Left   states he writes with right, heavy tasks with left   Next MD Visit  --   patient reports in one month   Prior Therapy  None  Precautions   Precautions  Shoulder    Type of Shoulder Precautions  TSA    Shoulder Interventions  Shoulder sling/immobilizer;Off for dressing/bathing/exercises;At all times    Precaution Comments  Avoid shoulder extension, IR behind back, ER > 30 deg, no AROM      Restrictions   Weight Bearing Restrictions  No      Balance Screen   Has the patient fallen in the past 6 months  Yes    How many times?  2, occurred when he had a seizure that resulted in injuries    Has the patient had a decrease in activity level because of a fear of falling?   No    Is the patient reluctant to leave their home because of a fear of falling?   No      Home Film/video editor residence    Living Arrangements  Spouse/significant other   girlfriend     Prior Function   Level of Independence  Independent with basic ADLs    Vocation  Unemployed      Cognition   Overall Cognitive Status  Within Functional Limits for tasks assessed      Observation/Other Assessments    Observations  Patient apprears guarded, sling is fit loose    Focus on Therapeutic Outcomes (FOTO)   70% limitation      Observation/Other Assessments-Edema    Edema  --   Patient exhibits visible edema of right shoulder     Sensation   Light Touch  Appears Intact      Posture/Postural Control   Posture Comments  Rounded shoulder posture      ROM / Strength   AROM / PROM / Strength  PROM;AROM      AROM   AROM Assessment Site  Shoulder    Right/Left Shoulder  Left    Left Shoulder Flexion  100 Degrees    Left Shoulder ABduction  80 Degrees    Left Shoulder Internal Rotation  --    L5   Left Shoulder External Rotation  --   C5     PROM   PROM Assessment Site  Shoulder    Right/Left Shoulder  Right    Right Shoulder Flexion  70 Degrees   limited by pain   Right Shoulder External Rotation  -15 Degrees   unable to reach neutral, limited by pain and stiffness     Palpation   Palpation comment  General right shoulder post-op tenderness, increased upper trap tension      Transfers   Transfers  Independent with all Transfers                Objective measurements completed on examination: See above findings.      [place] Adult PT Treatment/Exercise - 03/22/19 0001      Exercises   Exercises  Shoulder      Shoulder Exercises: Supine   External Rotation  PROM;10 reps   5 second hold   External Rotation Limitations  dowel      Shoulder Exercises: Seated   Elevation  PROM;10 reps   5 second hold   Elevation Limitations  forward table slide, use of left arm for assist      Shoulder Exercises: Standing   Other Standing Exercises  Supported pendulums circles x20       Shoulder Exercises: Stretch   Other Shoulder Stretches  Seated upper trap stretch x30 sec  PT Education - 03/21/19 1632    Education Details  Exam findings, surgical post-op precautions, POC, HEP, posture and positioning    Person(s) Educated  Patient    Methods   Explanation;Demonstration;Verbal cues;Handout    Comprehension  Verbalized understanding;Returned demonstration;Verbal cues required;Need further instruction       PT Short Term Goals - 03/22/19 0752      PT SHORT TERM GOAL #1   Title  Patient will be indepdnent with initial HEP to progress range of motion.    Time  2    Period  Weeks    Status  New    Target Date  04/04/19      PT SHORT TERM GOAL #2   Title  Patient will report < or  = 3-4/10 with exercise in order to improve activity tolerance and allow progression    Time  3    Period  Weeks    Status  New    Target Date  04/11/19      PT SHORT TERM GOAL #3   Title  Patient will achieve right shoulder PROM 90 deg elevation and 30 deg ER to reach milestones.    Time  3    Period  Weeks    Status  New    Target Date  04/11/19        PT Long Term Goals - 03/22/19 0755      PT LONG TERM GOAL #1   Title  Patient will report < or = 2-3/10 pain level with activity to allow for improved functional level.    Time  6    Period  Weeks    Status  New    Target Date  05/02/19      PT LONG TERM GOAL #2   Title  Patient will report improved functional level by FOTO < or = 55%.    Time  6    Period  Weeks    Status  New    Target Date  05/02/19      PT LONG TERM GOAL #3   Title  Patient will be progressing with active range of motion and will achieve PROM [facility].    Time  6    Period  Weeks    Status  New    Target Date  05/02/19      PT LONG TERM GOAL #4   Title  Establish new long term goals at 6 week assessment.             Plan - 03/21/19 1634    Clinical Impression Statement  Patient presents to physical therapy following right shoulder hemiarthroplasty due to posterior shoulder dislocation after fall from seizure. He currently exhibit stiffness and pain of the right shoulder, and he is abiding by the sling precautions. He did require education for surgical post-op precautions and for new exercises. He would  benefit from continued skilled PT to progress his motion and through protocol in order to return to PLOF.    Personal Factors and Comorbidities  Past/Current Experience;Social Background;Finances;Comorbidity 2    Comorbidities  DM, seizures    Examination-Activity Limitations  Bathing;Dressing;Hygiene/Grooming;Sleep;Lift;Caring for Others;Carry;Reach Overhead    Examination-Participation Restrictions  Community Activity;Driving;Laundry;Cleaning;Yard Work;Meal Prep    Stability/Clinical Decision Making  Evolving/Moderate complexity    Clinical Decision Making  Moderate    Rehab Potential  Good    PT Frequency  2x / week    PT Duration  6 weeks    PT Treatment/Interventions  ADLs/Self Care Home Management;Cryotherapy;Electrical Stimulation;Moist Heat;Therapeutic activities;Therapeutic exercise;Neuromuscular re-education;Patient/family education;Manual techniques;Dry needling;Passive range of motion;Scar mobilization;Taping;Joint Manipulations;Spinal Manipulations    PT Next Visit Plan  Assess HEP, progress PROM, ensure understanding of precautions    PT Home Exercise Plan  Seated or standing forward table slide shoulder PROM, supine shoulder ER PROM with dowel, standing supported pendulum circles    Consulted and Agree with Plan of Care  Patient       Patient will benefit from skilled therapeutic intervention in order to improve the following deficits and impairments:  Decreased range of motion, Pain, Postural dysfunction, Impaired UE functional use, Decreased strength, Increased muscle spasms, Decreased knowledge of precautions, Increased edema  Visit Diagnosis: Acute pain of right shoulder  Stiffness of right shoulder, not elsewhere classified  Muscle weakness (generalized)  Localized edema     Problem List Patient Active Problem List   Diagnosis Date Noted  . 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 Hypertension 01/14/2019  . Acute metabolic encephalopathy 01/14/2019  . DKA  (diabetic ketoacidoses) (HCC) 01/13/2019  . Diabetes mellitus without complication (HCC)   . History of seizure   . Posterior dislocation of left shoulder joint   . Posterior dislocation of right shoulder joint     Rosana Hoesampbell Lemon Sternberg, PT, DPT, LAT, ATC 03/22/19  8:04 AM Phone: [phone] Fax: [phone]   [facility] [address] [place], [place], [address] Phone: [phone]   Fax:  [phone]  Name: Alan Alvarez MRN: [id number] Date of Birth: 06/15/1980

## 2019-03-28 ENCOUNTER — Other Ambulatory Visit: Payer: Self-pay

## 2019-03-28 ENCOUNTER — Encounter: Payer: Self-pay | Admitting: Physical Therapy

## 2019-03-28 ENCOUNTER — Ambulatory Visit: Payer: Self-pay | Attending: Orthopedic Surgery | Admitting: Physical Therapy

## 2019-03-28 DIAGNOSIS — M25611 Stiffness of right shoulder, not elsewhere classified: Secondary | ICD-10-CM | POA: Insufficient documentation

## 2019-03-28 DIAGNOSIS — M6281 Muscle weakness (generalized): Secondary | ICD-10-CM | POA: Insufficient documentation

## 2019-03-28 DIAGNOSIS — R600 Localized edema: Secondary | ICD-10-CM | POA: Insufficient documentation

## 2019-03-28 DIAGNOSIS — M25511 Pain in right shoulder: Secondary | ICD-10-CM | POA: Insufficient documentation

## 2019-03-28 NOTE — Therapy (Signed)
[facility] Outpatient [facility] [address] [place], [place], [address] Phone: [phone]   Fax:  [phone]  Physical Therapy Treatment  Patient Details  Name: Alan Alvarez MRN: [id number] Date of Birth: 05-11-1980 Referring Provider (PT): Yolonda Kida, MD   Encounter Date: 03/28/2019  PT End of Session - 03/28/19 0825    Visit Number  2    Number of Visits  12    Date for PT Re-Evaluation  05/02/19    Authorization Type  Self pay    PT Start Time  0825    PT Stop Time  0910    PT Time Calculation (min)  45 min    Activity Tolerance  Patient tolerated treatment well    Behavior During Therapy  [facility] for tasks assessed/performed       Past Medical History:  Diagnosis Date  . Diabetes mellitus without complication (HCC)    type 2  . History of seizure   . Hyperlipidemia   . Hypertension     Past Surgical History:  Procedure Laterality Date  . SHOULDER HEMI-ARTHROPLASTY Right 03/03/2019   Procedure: SHOULDER HEMI-ARTHROPLASTY;  Surgeon: Yolonda Kida, MD;  Location: [facility] OR;  Service: Orthopedics;  Laterality: Right;    There were no vitals filed for this visit.  Subjective Assessment - 03/28/19 0823    Subjective  Patient is abotu 3 and a half weeks post-op. He reports his shoulder is very painful. He does exercises 1x/day.    Currently in Pain?  Yes    Pain Score  9     Pain Location  Shoulder    Pain Orientation  Right    Pain Descriptors / Indicators  Throbbing;Tightness;Aching    Pain Type  Surgical pain    Pain Onset  1 to 4 weeks ago    Pain Frequency  Constant         [facility] PT Assessment - 03/28/19 0001      Assessment   Medical Diagnosis  s/p right shoulder hemiarthroplasty    Referring Provider (PT)  Yolonda Kida, MD    Onset Date/Surgical Date  03/03/19    Next MD Visit  04/13/2019      Precautions   Precautions  Shoulder    Type of Shoulder Precautions  TSA    Shoulder Interventions   Shoulder sling/immobilizer;Off for dressing/bathing/exercises;At all times    Precaution Comments  Avoid shoulder extension, IR behind back, ER > 30 deg, no AROM      ROM / Strength   AROM / PROM / Strength  PROM      PROM   PROM Assessment Site  Shoulder    Right/Left Shoulder  Right    Right Shoulder Flexion  90 Degrees   pain at end range   Right Shoulder External Rotation  0 Degrees   limited by pain                  [facility] Adult PT Treatment/Exercise - 03/28/19 0001      Self-Care   Self-Care  Heat/Ice Application;Other Self-Care Comments    Heat/Ice Application  Use ice throughout day for pain control    Other Self-Care Comments   Proper sling wear, surgical precautions      Exercises   Exercises  Shoulder      Shoulder Exercises: Supine   External Rotation  PROM;5 reps   10 sec hold   External Rotation Limitations  dowel      Shoulder Exercises:  Seated   Retraction  10 reps    Retraction Limitations  Cueing to make sure elbow stays in front of body      Shoulder Exercises: Standing   Shoulder Elevation  PROM;5 reps   10 sec hold   Shoulder Elevation Limitations  forward table slide, use of left arm for assist      Modalities   Modalities  Vasopneumatic      Vasopneumatic   Number Minutes Vasopneumatic   10 minutes    Vasopnuematic Location   Shoulder    Vasopneumatic Pressure  Low    Vasopneumatic Temperature   34      Manual Therapy   Manual Therapy  Passive ROM    Passive ROM  Shoulder elevation, ER, IR in scapular plane             PT Education - 03/28/19 0824    Education Details  HEP    Person(s) Educated  Patient    Methods  Explanation;Demonstration;Verbal cues    Comprehension  Verbalized understanding;Returned demonstration;Verbal cues required;Need further instruction       PT Short Term Goals - 03/22/19 0752      PT SHORT TERM GOAL #1   Title  Patient will be indepdnent with initial HEP to progress range of motion.     Time  2    Period  Weeks    Status  New    Target Date  04/04/19      PT SHORT TERM GOAL #2   Title  Patient will report < or  = 3-4/10 with exercise in order to improve activity tolerance and allow progression    Time  3    Period  Weeks    Status  New    Target Date  04/11/19      PT SHORT TERM GOAL #3   Title  Patient will achieve right shoulder PROM 90 deg elevation and 30 deg ER to reach milestones.    Time  3    Period  Weeks    Status  New    Target Date  04/11/19        PT Long Term Goals - 03/22/19 0755      PT LONG TERM GOAL #1   Title  Patient will report < or = 2-3/10 pain level with activity to allow for improved functional level.    Time  6    Period  Weeks    Status  New    Target Date  05/02/19      PT LONG TERM GOAL #2   Title  Patient will report improved functional level by FOTO < or = 55%.    Time  6    Period  Weeks    Status  New    Target Date  05/02/19      PT LONG TERM GOAL #3   Title  Patient will be progressing with active range of motion and will [facility].    Time  6    Period  Weeks    Status  New    Target Date  05/02/19      PT LONG TERM GOAL #4   Title  Establish new long term goals at 6 week assessment.            Plan - 03/28/19 0825    Clinical Impression Statement  Patient is exhibiting slight improvement in PROM, but continues to have significant pain at end ranges of motion. He  was re-educated on post-op precautions and HEP, and proper slin wear. He would benefit from continued skilled PT to progress his motion and return to PLOF.    PT Frequency  2x / week    PT Treatment/Interventions  ADLs/Self Care Home Management;Cryotherapy;Electrical Stimulation;Moist Heat;Therapeutic activities;Therapeutic exercise;Neuromuscular re-education;Patient/family education;Manual techniques;Dry needling;Passive range of motion;Scar mobilization;Taping;Joint Manipulations;Spinal Manipulations    PT Next Visit Plan  Assess HEP  and ensure proper sling wear; progress PROM shoulder flexion, IR, and ER <30 deg all in scap plane; deltoid isometrics, elbow/wrist/hand AROM    PT Home Exercise Plan  Seated or standing forward table slide shoulder PROM, supine shoulder ER PROM with dowel, standing supported pendulum circles, seated scap squeezes    Consulted and Agree with Plan of Care  Patient       Patient will benefit from skilled therapeutic intervention in order to improve the following deficits and impairments:  Decreased range of motion, Pain, Postural dysfunction, Impaired UE functional use, Decreased strength, Increased muscle spasms, Decreased knowledge of precautions, Increased edema  Visit Diagnosis: Acute pain of right shoulder  Stiffness of right shoulder, not elsewhere classified  Muscle weakness (generalized)  Localized edema     Problem List Patient Active Problem List   Diagnosis Date Noted  . 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 Hypertension 01/14/2019  . Acute metabolic encephalopathy 88/28/0034  . DKA (diabetic ketoacidoses) ([place]) 01/13/2019  . Diabetes mellitus without complication ([place])   . History of seizure   . Posterior dislocation of left shoulder joint   . Posterior dislocation of right shoulder joint     Hilda Blades, PT, DPT, LAT, ATC 03/28/19  9:11 AM Phone: [phone] Fax: [place] [facility] [address] [place], [place], [address] Phone: [phone]   Fax:  [phone]  Name: Alan Alvarez MRN: [id number] Date of Birth: Nov 01, 1980

## 2019-03-30 ENCOUNTER — Ambulatory Visit: Payer: Self-pay | Admitting: Physical Therapy

## 2019-03-30 ENCOUNTER — Other Ambulatory Visit: Payer: Self-pay

## 2019-03-30 DIAGNOSIS — M25611 Stiffness of right shoulder, not elsewhere classified: Secondary | ICD-10-CM

## 2019-03-30 DIAGNOSIS — M6281 Muscle weakness (generalized): Secondary | ICD-10-CM

## 2019-03-30 DIAGNOSIS — M25511 Pain in right shoulder: Secondary | ICD-10-CM

## 2019-03-30 DIAGNOSIS — R600 Localized edema: Secondary | ICD-10-CM

## 2019-03-30 NOTE — Therapy (Signed)
[facility] [address] [place], [place], [address] Phone: [phone]   Fax:  [phone]  Physical Therapy Treatment  Patient Details  Name: Alan Alvarez MRN: [id number] Date of Birth: 02/11/1981 Referring Provider (PT): Yolonda Kidaogers, Jason Patrick, MD   Encounter Date: 03/30/2019  PT End of Session - 03/30/19 0941    Visit Number  3    Number of Visits  12    Date for PT Re-Evaluation  05/02/19    Authorization Type  Self pay    PT Start Time  0838    PT Stop Time  0925    PT Time Calculation (min)  47 min       Past Medical History:  Diagnosis Date  . Diabetes mellitus without complication (HCC)    type 2  . History of seizure   . Hyperlipidemia   . Hypertension     Past Surgical History:  Procedure Laterality Date  . SHOULDER HEMI-ARTHROPLASTY Right 03/03/2019   Procedure: SHOULDER HEMI-ARTHROPLASTY;  Surgeon: Yolonda Kidaogers, Jason Patrick, MD;  [facility];  Service: Orthopedics;  Laterality: Right;    There were no vitals filed for this visit.  Subjective Assessment - 03/30/19 0942    Subjective  Pt reports shoulder is very painful and constant. He reports difficulty sleeping.    Currently in Pain?  Yes    Pain Score  5     Pain Location  Shoulder    Pain Orientation  Right    Pain Descriptors / Indicators  Aching;Throbbing;Tightness    Pain Type  Surgical pain    Aggravating Factors   constant, any movement    Pain Relieving Factors  ice, meds help some however hurt stomach         [facility] PT Assessment - 03/30/19 0001      PROM   Right Shoulder Flexion  90 Degrees   pain at end range   Right Shoulder External Rotation  2 Degrees   pain at end range                   [facility]/Exercise - 03/30/19 0001      Shoulder Exercises: Standing   Retraction  10 reps    Retraction Limitations  cueing to avoid shoulder extension       Shoulder Exercises: ROM/Strengthening   Other  ROM/Strengthening Exercises  wrist flexion and extension AROM     Other ROM/Strengthening Exercises  rolled towel squeeze 5 sec x 20       Modalities   Modalities  Electrical Stimulation      Electrical Stimulation   Electrical Stimulation Location  Right shoulder    Electrical Stimulation Action  IFC x 10 minutes     Electrical Stimulation Parameters  12 mA    Electrical Stimulation Goals  Pain      Vasopneumatic   Number Minutes Vasopneumatic   10 minutes    Vasopnuematic Location   Shoulder    Vasopneumatic Pressure  Low    Vasopneumatic Temperature   34      Manual Therapy   Manual Therapy  Passive ROM    Passive ROM  Shoulder elevation, ER, IR in scapular plane               PT Short Term Goals - 03/22/19 [id number]      PT SHORT TERM GOAL #1   Title  Patient will be indepdnent with initial HEP to progress range of motion.  Time  2    Period  Weeks    Status  New    Target Date  04/04/19      PT SHORT TERM GOAL #2   Title  Patient will report < or  = 3-4/10 with exercise in order to improve activity tolerance and allow progression    Time  3    Period  Weeks    Status  New    Target Date  04/11/19      PT SHORT TERM GOAL #3   Title  Patient will achieve right shoulder PROM 90 deg elevation and 30 deg ER to reach milestones.    Time  3    Period  Weeks    Status  New    Target Date  04/11/19        PT Long Term Goals - 03/22/19 0755      PT LONG TERM GOAL #1   Title  Patient will report < or = 2-3/10 pain level with activity to allow for improved functional level.    Time  6    Period  Weeks    Status  New    Target Date  05/02/19      PT LONG TERM GOAL #2   Title  Patient will report improved functional level by FOTO < or = 55%.    Time  6    Period  Weeks    Status  New    Target Date  05/02/19      PT LONG TERM GOAL #3   Title  Patient will be progressing with active range of motion and will achieve PROM [facility].    Time  6    Period  Weeks     Status  New    Target Date  05/02/19      PT LONG TERM GOAL #4   Title  Establish new long term goals at 6 week assessment.            Plan - 03/30/19 0943    Clinical Impression Statement  Pt arrives in sling and sits in slouched guarded posture. Education provided on proper sitting posture with cues to avoid shoulder extension. Instructed pr in elbow and wrist AROM , towel squeezing and grip. He reports pain in these areas as well as shoulder pain with each exercise. PROM for Flexion and ER performed to tolerance with cues for relaxation. Trial of IFC with Vaso today to decrease pain. At end of session he reported he thinks the Vaso/ice is more effective in decreasing his pain.    PT Next Visit Plan  Further assess if IFC was helpful last sessioin. He likes VASO; Assess HEP and ensure proper sling wear; progress PROM shoulder flexion, IR, and ER <30 deg all in scap plane; deltoid isometrics, elbow/wrist/hand AROM    PT Home Exercise Plan  Seated or standing forward table slide shoulder PROM, supine shoulder ER PROM with dowel, standing supported pendulum circles, seated scap squeezes       Patient will benefit from skilled therapeutic intervention in order to improve the following deficits and impairments:  Decreased range of motion, Pain, Postural dysfunction, Impaired UE functional use, Decreased strength, Increased muscle spasms, Decreased knowledge of precautions, Increased edema  Visit Diagnosis: Acute pain of right shoulder  Stiffness of right shoulder, not elsewhere classified  Muscle weakness (generalized)  Localized edema     Problem List Patient Active Problem List   Diagnosis Date Noted  . S/P shoulder  replacement, right 03/03/2019  . Hypertension 01/14/2019  . Acute metabolic encephalopathy 97/53/0051  . DKA (diabetic ketoacidoses) ([place]) 01/13/2019  . Diabetes mellitus without complication ([place])   . History of seizure   . Posterior dislocation of left  shoulder joint   . Posterior dislocation of right shoulder joint     [name], PTA 03/30/2019, 9:47 AM  [place], [place], [address] Phone: [phone]   Fax:  [phone]  Name: Alan Alvarez MRN: [id number] Date of Birth: 1980-06-28

## 2019-04-04 ENCOUNTER — Ambulatory Visit: Payer: Self-pay | Admitting: Physical Therapy

## 2019-04-04 ENCOUNTER — Other Ambulatory Visit: Payer: Self-pay

## 2019-04-04 ENCOUNTER — Encounter: Payer: Self-pay | Admitting: Physical Therapy

## 2019-04-04 DIAGNOSIS — M25511 Pain in right shoulder: Secondary | ICD-10-CM

## 2019-04-04 DIAGNOSIS — R600 Localized edema: Secondary | ICD-10-CM

## 2019-04-04 DIAGNOSIS — M6281 Muscle weakness (generalized): Secondary | ICD-10-CM

## 2019-04-04 DIAGNOSIS — M25611 Stiffness of right shoulder, not elsewhere classified: Secondary | ICD-10-CM

## 2019-04-04 NOTE — Therapy (Signed)
[place], [place], [address] Phone: [phone]   Fax:  [phone]  Physical Therapy Treatment  Patient Details  Name: Alan Alvarez MRN: [id number] Date of Birth: 1980/07/05 Referring Provider (PT): Nicholes Stairs, MD   Encounter Date: 04/04/2019  PT End of Session - 04/04/19 0847    Visit Number  4    Number of Visits  12    Date for PT Re-Evaluation  05/02/19    Authorization Type  Self pay    PT Start Time  [phone]   patient arrived late   PT Stop Time  0913    PT Time Calculation (min)  38 min    Activity Tolerance  Patient tolerated treatment well    Behavior During Therapy  [facility] for tasks assessed/performed       Past Medical History:  Diagnosis Date  . Diabetes mellitus without complication ([place])    type 2  . History of seizure   . Hyperlipidemia   . Hypertension     Past Surgical History:  Procedure Laterality Date  . SHOULDER HEMI-ARTHROPLASTY Right 03/03/2019   Procedure: SHOULDER HEMI-ARTHROPLASTY;  Surgeon: Nicholes Stairs, MD;  Location: [place];  Service: Orthopedics;  Laterality: Right;    There were no vitals filed for this visit.  Subjective Assessment - 04/04/19 0836    Subjective  Patient reports continued severe right shoulder pain and trouble sleeping. He does note he feels better.    Currently in Pain?  Yes    Pain Score  6     Pain Location  Shoulder    Pain Orientation  Right    Pain Descriptors / Indicators  Aching;Throbbing;Tightness;Sharp    Pain Type  Surgical pain    Pain Onset  More than a month ago    Pain Frequency  Constant         [facility] PT Assessment - 04/04/19 0001      PROM   Right Shoulder Flexion  90 Degrees   limited by pain   Right Shoulder External Rotation  5 Degrees   limited by pain and stiffness                  [facility] Adult PT Treatment/Exercise - 04/04/19 0001      Exercises   Exercises  Shoulder      Shoulder Exercises: Supine   External Rotation  PROM;5 reps   10 sec hold     Shoulder Exercises: Seated   Elevation  PROM;5 reps    Elevation Limitations  forward table slide x10 sec, use of left arm for assist    Retraction  10 reps      Shoulder Exercises: Isometric Strengthening   Other Isometric Exercises  Deltoid isometrics 5x5" in supine - exhibits good activation for all heads of the deltoid      Manual Therapy   Manual Therapy  Passive ROM    Passive ROM  Shoulder elevation, ER, IR in scapular plane             PT Education - 04/04/19 0840    Education Details  HEP    Person(s) Educated  Patient    Methods  Explanation;Demonstration;Verbal cues;Tactile cues;Handout    Comprehension  Verbalized understanding;Returned demonstration;Verbal cues required;Tactile cues required;Need further instruction       PT Short Term Goals - 04/04/19 0912      PT SHORT TERM GOAL #1   Title  Patient will be indepdnent  with initial HEP to progress range of motion.    Time  2    Period  Weeks    Status  Partially Met    Target Date  04/04/19      PT SHORT TERM GOAL #2   Title  Patient will report < or  = 3-4/10 with exercise in order to improve activity tolerance and allow progression    Time  3    Period  Weeks    Status  New    Target Date  04/11/19      PT SHORT TERM GOAL #3   Title  Patient will achieve right shoulder PROM 90 deg elevation and 30 deg ER to reach milestones.    Time  3    Period  Weeks    Status  New    Target Date  04/11/19        PT Long Term Goals - 03/22/19 0755      PT LONG TERM GOAL #1   Title  Patient will report < or = 2-3/10 pain level with activity to allow for improved functional level.    Time  6    Period  Weeks    Status  New    Target Date  05/02/19      PT LONG TERM GOAL #2   Title  Patient will report improved functional level by FOTO < or = 55%.    Time  6    Period  Weeks    Status  New    Target Date  05/02/19       PT LONG TERM GOAL #3   Title  Patient will be progressing with active range of motion and will achieve PROM [facility].    Time  6    Period  Weeks    Status  New    Target Date  05/02/19      PT LONG TERM GOAL #4   Title  Establish new long term goals at 6 week assessment.            Plan - 04/04/19 0848    Clinical Impression Statement  Patient exhibits improvement in PROm but continues to struggle with ER and he has significant pain at end ranges of motion. He was highly encouraged to work on his stretching at home and he was educated on proper posturing and pain management modalities. He was provided with banded shoulder strengthening for the left shoulder because patient reports onset of left shouler pain because he is performing all tasks with left arm. He would benefit from cotntinued skilled PT to progress his motion as protocol allows.    PT Treatment/Interventions  ADLs/Self Care Home Management;Cryotherapy;Electrical Stimulation;Moist Heat;Therapeutic activities;Therapeutic exercise;Neuromuscular re-education;Patient/family education;Manual techniques;Dry needling;Passive range of motion;Scar mobilization;Taping;Joint Manipulations;Spinal Manipulations    PT Next Visit Plan  Assess HEP and ensure proper sling wear/posture; progress PROM shoulder flexion, IR, and ER <30 deg all in scap plane; deltoid isometrics, elbow/wrist/hand AROM    PT Home Exercise Plan  Seated or standing forward table slide shoulder PROM, supine shoulder ER PROM with dowel, standing supported pendulum circles, seated scap squeezes    Consulted and Agree with Plan of Care  Patient       Patient will benefit from skilled therapeutic intervention in order to improve the following deficits and impairments:  Decreased range of motion, Pain, Postural dysfunction, Impaired UE functional use, Decreased strength, Increased muscle spasms, Decreased knowledge of precautions, Increased edema  Visit Diagnosis: Acute pain of  right shoulder  Stiffness of right shoulder, not elsewhere classified  Muscle weakness (generalized)  Localized edema     Problem List Patient Active Problem List   Diagnosis Date Noted  . 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 Hypertension 01/14/2019  . Acute metabolic encephalopathy 17/49/4496  . DKA (diabetic ketoacidoses) ([place]) 01/13/2019  . Diabetes mellitus without complication ([place])   . History of seizure   . Posterior dislocation of left shoulder joint   . Posterior dislocation of right shoulder joint     Hilda Blades, PT, DPT, LAT, ATC 04/04/19  9:39 AM Phone: [phone] Fax: [facility] [facility] [address] [place], [place], [address] Phone: [phone]   Fax:  [phone]  Name: Daelyn Mozer MRN: [id number] Date of Birth: [birth year]

## 2019-04-06 ENCOUNTER — Other Ambulatory Visit: Payer: Self-pay

## 2019-04-06 ENCOUNTER — Encounter: Payer: Self-pay | Admitting: Physical Therapy

## 2019-04-06 ENCOUNTER — Ambulatory Visit: Payer: Self-pay | Admitting: Physical Therapy

## 2019-04-06 DIAGNOSIS — M6281 Muscle weakness (generalized): Secondary | ICD-10-CM

## 2019-04-06 DIAGNOSIS — M25511 Pain in right shoulder: Secondary | ICD-10-CM

## 2019-04-06 DIAGNOSIS — M25611 Stiffness of right shoulder, not elsewhere classified: Secondary | ICD-10-CM

## 2019-04-06 DIAGNOSIS — R600 Localized edema: Secondary | ICD-10-CM

## 2019-04-06 NOTE — Therapy (Signed)
[facility] [address] [place], [place], [address] Phone: [phone]   Fax:  [phone]  Physical Therapy Treatment  Patient Details  Name: Alan Alvarez MRN: [id number] Date of Birth: 03/25/1981 Referring Provider (PT): Rogers, Jason Patrick, MD   Encounter Date: 04/06/2019  PT End of Session - 04/06/19 0848    Visit Number  5    Number of Visits  12    Date for PT Re-Evaluation  05/02/19    Authorization Type  Self pay    PT Start Time  0845    PT Stop Time  0930    PT Time Calculation (min)  45 min       Past Medical History:  Diagnosis Date  . Diabetes mellitus without complication (HCC)    type 2  . History of seizure   . Hyperlipidemia   . Hypertension     Past Surgical History:  Procedure Laterality Date  . SHOULDER HEMI-ARTHROPLASTY Right 03/03/2019   Procedure: SHOULDER HEMI-ARTHROPLASTY;  Surgeon: Rogers, Jason Patrick, MD;  [facility];  Service: Orthopedics;  Laterality: Right;    There were no vitals filed for this visit.      [facility] PT Assessment - 04/06/19 0001      PROM   Right Shoulder Flexion  90 Degrees   limited by pain   Right Shoulder External Rotation  5 Degrees                   [facility] Adult PT Treatment/Exercise - 04/06/19 0001      Shoulder Exercises: Seated   Elevation  PROM;20 reps    Elevation Limitations  forward table slide x10 sec, use of left arm for assist    External Rotation  PROM;10 reps    External Rotation Limitations  using table to support arm       Shoulder Exercises: Standing   Retraction  10 reps    Other Standing Exercises  pendulums    Other Standing Exercises  shoulder rolls, elbow flexion, long arm IR/ER AROM      Shoulder Exercises: ROM/Strengthening   Other ROM/Strengthening Exercises  wrist flexion and extension AROM       Vasopneumatic   Number Minutes Vasopneumatic   10 minutes    Vasopnuematic Location   Shoulder    Vasopneumatic Pressure  Medium    Vasopneumatic Temperature   34      Manual Therapy   Manual Therapy  Passive ROM    Passive ROM  Shoulder elevation, ER, IR in scapular plane               PT Short Term Goals - 04/04/19 0912      PT SHORT TERM GOAL #1   Title  Patient will be indepdnent with initial HEP to progress range of motion.    Time  2    Period  Weeks    Status  Partially Met    Target Date  04/04/19      PT SHORT TERM GOAL #2   Title  Patient will report < or  = 3-4/10 with exercise in order to improve activity tolerance and allow progression    Time  3    Period  Weeks    Status  New    Target Date  04/11/19      PT SHORT TERM GOAL #3   Title  Patient will achieve right shoulder PROM 90 deg elevation and 30 deg ER to reach milestones.      Time  3    Period  Weeks    Status  New    Target Date  04/11/19        PT Long Term Goals - 03/22/19 0755      PT LONG TERM GOAL #1   Title  Patient will report < or = 2-3/10 pain level with activity to allow for improved functional level.    Time  6    Period  Weeks    Status  New    Target Date  05/02/19      PT LONG TERM GOAL #2   Title  Patient will report improved functional level by FOTO < or = 55%.    Time  6    Period  Weeks    Status  New    Target Date  05/02/19      PT LONG TERM GOAL #3   Title  Patient will be progressing with active range of motion and will achieve PROM WFL.    Time  6    Period  Weeks    Status  New    Target Date  05/02/19      PT LONG TERM GOAL #4   Title  Establish new long term goals at 6 week assessment.            Plan - 04/06/19 0923    Clinical Impression Statement  Pt reports shoulder pain is decreased. He is tolerating self PROM, manual PROM and pendulums with less pain. Vaso at end of session to decreased pain.    PT Next Visit Plan  Assess HEP and ensure proper sling wear/posture; progress PROM shoulder flexion, IR, and ER <30 deg all in scap plane;  deltoid isometrics, elbow/wrist/hand AROM    PT Home Exercise Plan  Seated or standing forward table slide shoulder PROM, supine shoulder ER PROM with dowel, standing supported pendulum circles, seated scap squeezes       Patient will benefit from skilled therapeutic intervention in order to improve the following deficits and impairments:  Decreased range of motion, Pain, Postural dysfunction, Impaired UE functional use, Decreased strength, Increased muscle spasms, Decreased knowledge of precautions, Increased edema  Visit Diagnosis: Acute pain of right shoulder  Stiffness of right shoulder, not elsewhere classified  Muscle weakness (generalized)  Localized edema     Problem List Patient Active Problem List   Diagnosis Date Noted  . 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 Hypertension 01/14/2019  . Acute metabolic encephalopathy 01/14/2019  . DKA (diabetic ketoacidoses) ([facility]) 01/13/2019  . Diabetes mellitus without complication ([facility])   . History of seizure   . Posterior dislocation of left shoulder joint   . Posterior dislocation of right shoulder joint     [place], Jessica McGee, PTA 04/06/2019, 9:25 AM  [facility] [address] [place], [place], [address] Phone: [phone]   Fax:  [phone]  Name: Alan Alvarez MRN: [id number] Date of Birth: 12/17/1980   

## 2019-04-11 ENCOUNTER — Other Ambulatory Visit: Payer: Self-pay

## 2019-04-11 ENCOUNTER — Ambulatory Visit: Payer: Self-pay | Admitting: Physical Therapy

## 2019-04-11 ENCOUNTER — Encounter: Payer: Self-pay | Admitting: Physical Therapy

## 2019-04-11 DIAGNOSIS — M6281 Muscle weakness (generalized): Secondary | ICD-10-CM

## 2019-04-11 DIAGNOSIS — M25511 Pain in right shoulder: Secondary | ICD-10-CM

## 2019-04-11 DIAGNOSIS — R600 Localized edema: Secondary | ICD-10-CM

## 2019-04-11 DIAGNOSIS — M25611 Stiffness of right shoulder, not elsewhere classified: Secondary | ICD-10-CM

## 2019-04-11 NOTE — Therapy (Signed)
[place], [place], [address] Phone: [phone]   Fax:  [phone]  Physical Therapy Treatment  Patient Details  Name: Alan Alvarez MRN: [id number] Date of Birth: January 04, 1981 Referring Provider (PT): Nicholes Stairs, MD   Encounter Date: 04/11/2019  PT End of Session - 04/11/19 0803    Visit Number  6    Number of Visits  12    Date for PT Re-Evaluation  05/02/19    Authorization Type  Self pay    PT Start Time  0800    PT Stop Time  0845    PT Time Calculation (min)  45 min       Past Medical History:  Diagnosis Date  . Diabetes mellitus without complication ([place])    type 2  . History of seizure   . Hyperlipidemia   . Hypertension     Past Surgical History:  Procedure Laterality Date  . SHOULDER HEMI-ARTHROPLASTY Right 03/03/2019   Procedure: SHOULDER HEMI-ARTHROPLASTY;  Surgeon: Nicholes Stairs, MD;  Location: [facility];  Service: Orthopedics;  Laterality: Right;    There were no vitals filed for this visit.  Subjective Assessment - 04/11/19 0803    Subjective  5-6/10 pain, hurts more at night when I lay down.    Currently in Pain?  Yes    Pain Score  6     Pain Location  Shoulder    Pain Orientation  Right         [facility] PT Assessment - 04/11/19 0001      PROM   Right Shoulder Flexion  95 Degrees    Right Shoulder External Rotation  5 Degrees                   [facility] Adult PT Treatment/Exercise - 04/11/19 0001      Shoulder Exercises: Supine   External Rotation  PROM;5 reps   10 sec hold     Shoulder Exercises: Seated   Elevation Limitations  forward table slide x10 sec, use of left arm for assist    External Rotation  PROM;10 reps    External Rotation Limitations  using table to support arm    also low ER in doorway for passive stretch      Shoulder Exercises: Standing   Retraction  20 reps    Other Standing Exercises  pendulums, clasped hand  deltoid isometrics x 10     Other Standing Exercises  shoulder rolls, elbow flexion, long arm IR/ER AROM      Vasopneumatic   Number Minutes Vasopneumatic   10 minutes    Vasopnuematic Location   Shoulder    Vasopneumatic Pressure  Medium    Vasopneumatic Temperature   34      Manual Therapy   Manual Therapy  Passive ROM    Manual therapy comments  soft tissue work peri scar and posterior shoulder/upper lateral arm.     Passive ROM  Shoulder elevation, ER, IR in scapular plane               PT Short Term Goals - 04/04/19 0912      PT SHORT TERM GOAL #1   Title  Patient will be indepdnent with initial HEP to progress range of motion.    Time  2    Period  Weeks    Status  Partially Met    Target Date  04/04/19      PT SHORT TERM GOAL #2  Title  Patient will report < or  = 3-4/10 with exercise in order to improve activity tolerance and allow progression    Time  3    Period  Weeks    Status  New    Target Date  04/11/19      PT SHORT TERM GOAL #3   Title  Patient will achieve right shoulder PROM 90 deg elevation and 30 deg ER to reach milestones.    Time  3    Period  Weeks    Status  New    Target Date  04/11/19        PT Long Term Goals - 03/22/19 0755      PT LONG TERM GOAL #1   Title  Patient will report < or = 2-3/10 pain level with activity to allow for improved functional level.    Time  6    Period  Weeks    Status  New    Target Date  05/02/19      PT LONG TERM GOAL #2   Title  Patient will report improved functional level by FOTO < or = 55%.    Time  6    Period  Weeks    Status  New    Target Date  05/02/19      PT LONG TERM GOAL #3   Title  Patient will be progressing with active range of motion and will achieve PROM [facility].    Time  6    Period  Weeks    Status  New    Target Date  05/02/19      PT LONG TERM GOAL #4   Title  Establish new long term goals at 6 week assessment.            Plan - 04/11/19 0909    Clinical  Impression Statement  Pt reports he is about the same and continues with mot dificulty sleeping Continued with PROM and self PROM. Table and doorway used to assist PROM. Soft tissue work performed to right shoulder Prior to [facility] PROM. Noted min improvement in flexion and ER ROM.    PT Next Visit Plan  Assess HEP and ensure proper sling wear/posture; progress PROM shoulder flexion, IR, and ER <30 deg all in scap plane; deltoid isometrics, elbow/wrist/hand AROM    PT Home Exercise Plan  Seated or standing forward table slide shoulder PROM, supine shoulder ER PROM with dowel, standing supported pendulum circles, seated scap squeezes       Patient will benefit from skilled therapeutic intervention in order to improve the following deficits and impairments:  Decreased range of motion, Pain, Postural dysfunction, Impaired UE functional use, Decreased strength, Increased muscle spasms, Decreased knowledge of precautions, Increased edema  Visit Diagnosis: Acute pain of right shoulder  Stiffness of right shoulder, not elsewhere classified  Muscle weakness (generalized)  Localized edema     Problem List Patient Active Problem List   Diagnosis Date Noted  . 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 Hypertension 01/14/2019  . Acute metabolic encephalopathy 60/63/0160  . DKA (diabetic ketoacidoses) ([place]) 01/13/2019  . Diabetes mellitus without complication ([place])   . History of seizure   . Posterior dislocation of left shoulder joint   . Posterior dislocation of right shoulder joint     [name], PTA 04/11/2019, 9:14 AM  [place], [place], [address] Phone: [phone]   Fax:  [phone]  Name: Alan  Alvarez MRN: [id number] Date of Birth: 1980-07-07

## 2019-04-13 ENCOUNTER — Encounter: Payer: Self-pay | Admitting: Physical Therapy

## 2019-04-14 ENCOUNTER — Ambulatory Visit: Payer: Self-pay | Admitting: Physical Therapy

## 2019-04-18 ENCOUNTER — Ambulatory Visit: Payer: Self-pay | Admitting: Physical Therapy

## 2019-04-18 ENCOUNTER — Other Ambulatory Visit: Payer: Self-pay

## 2019-04-18 ENCOUNTER — Encounter: Payer: Self-pay | Admitting: Physical Therapy

## 2019-04-18 DIAGNOSIS — M25511 Pain in right shoulder: Secondary | ICD-10-CM

## 2019-04-18 DIAGNOSIS — R600 Localized edema: Secondary | ICD-10-CM

## 2019-04-18 DIAGNOSIS — M6281 Muscle weakness (generalized): Secondary | ICD-10-CM

## 2019-04-18 DIAGNOSIS — M25611 Stiffness of right shoulder, not elsewhere classified: Secondary | ICD-10-CM

## 2019-04-18 NOTE — Therapy (Signed)
[place], [place], [address] Phone: [phone]   Fax:  [phone]  Physical Therapy Treatment  Patient Details  Name: Alan Alvarez MRN: [id number] Date of Birth: [birth year] Referring Provider (PT): Nicholes Stairs, MD   Encounter Date: 04/18/2019  PT End of Session - 04/18/19 0805    Visit Number  7    Number of Visits  12    Date for PT Re-Evaluation  05/02/19    Authorization Type  Self pay    PT Start Time  0802    PT Stop Time  0843    PT Time Calculation (min)  41 min       Past Medical History:  Diagnosis Date  . Diabetes mellitus without complication ([place])    type 2  . History of seizure   . Hyperlipidemia   . Hypertension     Past Surgical History:  Procedure Laterality Date  . SHOULDER HEMI-ARTHROPLASTY Right 03/03/2019   Procedure: SHOULDER HEMI-ARTHROPLASTY;  Surgeon: Nicholes Stairs, MD;  Location: [facility];  Service: Orthopedics;  Laterality: Right;    There were no vitals filed for this visit.  Subjective Assessment - 04/18/19 0803    Subjective  3-4/10. I had to cancel last visit. I was in too much pain in both shoulder and my knee and I could not stand up. I think it was the weather.    Currently in Pain?  Yes    Pain Score  3     Pain Location  Shoulder    Pain Orientation  Right    Pain Descriptors / Indicators  Aching;Throbbing;Tightness         [facility] PT Assessment - 04/18/19 0001      PROM   Right Shoulder Flexion  98 Degrees    Right Shoulder External Rotation  8 Degrees                   [facility] Adult PT Treatment/Exercise - 04/18/19 0001      Shoulder Exercises: Seated   Elevation Limitations  forward table slide x10 sec, use of left arm for assist    External Rotation  PROM;10 reps    External Rotation Limitations  using table to support arm    also low ER in doorway for passive stretch      Shoulder Exercises: Standing    Retraction  20 reps    Other Standing Exercises  pendulums, clasped hand deltoid isometrics x 10     Other Standing Exercises  shoulder rolls, elbow flexion, long arm IR/ER AROM      Shoulder Exercises: ROM/Strengthening   Other ROM/Strengthening Exercises  standing roll phyioball on table into flexion and small IR/ER rom       Shoulder Exercises: Stretch   External Rotation Stretch  3 reps;20 seconds   low arm in doorway      Vasopneumatic   Number Minutes Vasopneumatic   10 minutes    Vasopnuematic Location   Shoulder    Vasopneumatic Pressure  Medium    Vasopneumatic Temperature   34      Manual Therapy   Manual Therapy  Passive ROM    Manual therapy comments  soft tissue work peri scar and posterior shoulder/upper lateral arm.     Passive ROM  Shoulder elevation, ER, IR in scapular plane               PT Short Term Goals - 04/04/19 9485  PT SHORT TERM GOAL #1   Title  Patient will be indepdnent with initial HEP to progress range of motion.    Time  2    Period  Weeks    Status  Partially Met    Target Date  04/04/19      PT SHORT TERM GOAL #2   Title  Patient will report < or  = 3-4/10 with exercise in order to improve activity tolerance and allow progression    Time  3    Period  Weeks    Status  New    Target Date  04/11/19      PT SHORT TERM GOAL #3   Title  Patient will achieve right shoulder PROM 90 deg elevation and 30 deg ER to reach milestones.    Time  3    Period  Weeks    Status  New    Target Date  04/11/19        PT Long Term Goals - 03/22/19 0755      PT LONG TERM GOAL #1   Title  Patient will report < or = 2-3/10 pain level with activity to allow for improved functional level.    Time  6    Period  Weeks    Status  New    Target Date  05/02/19      PT LONG TERM GOAL #2   Title  Patient will report improved functional level by FOTO < or = 55%.    Time  6    Period  Weeks    Status  New    Target Date  05/02/19      PT  LONG TERM GOAL #3   Title  Patient will be progressing with active range of motion and will achieve PROM [facility].    Time  6    Period  Weeks    Status  New    Target Date  05/02/19      PT LONG TERM GOAL #4   Title  Establish new long term goals at 6 week assessment.            Plan - 04/18/19 0851    Clinical Impression Statement  More small improvements in flexion and ER PROM, measuring 98 flexion and 8 ER. Continued with [employer] PROM and  stretching. He needs cues for posture and tends to slouch when at rest.    PT Next Visit Plan  Assess HEP and ensure proper sling wear/posture; progress PROM shoulder flexion, IR, and ER <30 deg all in scap plane; deltoid isometrics, elbow/wrist/hand AROM    PT Home Exercise Plan  Seated or standing forward table slide shoulder PROM, supine shoulder ER PROM with dowel, standing supported pendulum circles, seated scap squeezes       Patient will benefit from skilled therapeutic intervention in order to improve the following deficits and impairments:  Decreased range of motion, Pain, Postural dysfunction, Impaired UE functional use, Decreased strength, Increased muscle spasms, Decreased knowledge of precautions, Increased edema  Visit Diagnosis: Acute pain of right shoulder  Stiffness of right shoulder, not elsewhere classified  Muscle weakness (generalized)  Localized edema     Problem List Patient Active Problem List   Diagnosis Date Noted  . 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 Hypertension 01/14/2019  . Acute metabolic encephalopathy 38/01/1750  . DKA (diabetic ketoacidoses) ([place]) 01/13/2019  . Diabetes mellitus without complication ([place])   . History of seizure   . Posterior dislocation  of left shoulder joint   . Posterior dislocation of right shoulder joint     [name], PTA 04/18/2019, 8:55 AM  [place], [place],  [address] Phone: [phone]   Fax:  [phone]  Name: Bodin Gorka MRN: [id number] Date of Birth: 1980/05/23

## 2019-04-20 ENCOUNTER — Ambulatory Visit: Payer: Self-pay | Admitting: Physical Therapy

## 2019-04-20 ENCOUNTER — Other Ambulatory Visit: Payer: Self-pay

## 2019-04-20 ENCOUNTER — Encounter: Payer: Self-pay | Admitting: Physical Therapy

## 2019-04-20 DIAGNOSIS — R600 Localized edema: Secondary | ICD-10-CM

## 2019-04-20 DIAGNOSIS — M25511 Pain in right shoulder: Secondary | ICD-10-CM

## 2019-04-20 DIAGNOSIS — M25611 Stiffness of right shoulder, not elsewhere classified: Secondary | ICD-10-CM

## 2019-04-20 DIAGNOSIS — M6281 Muscle weakness (generalized): Secondary | ICD-10-CM

## 2019-04-20 NOTE — Therapy (Signed)
[facility] [place], [place], [address] Phone: [phone]   Fax:  [phone]  Physical Therapy Treatment  Patient Details  Name: Alan Alvarez MRN: [id number] Date of Birth: 1981-04-08 Referring Provider (PT): Nicholes Stairs, MD   Encounter Date: 04/20/2019  PT End of Session - 04/20/19 1005    Visit Number  8    Number of Visits  12    Date for PT Re-Evaluation  05/02/19    Authorization Type  Self pay    PT Start Time  0915    PT Stop Time  1005    PT Time Calculation (min)  50 min    Activity Tolerance  Patient limited by pain    Behavior During Therapy  [facility] for tasks assessed/performed       Past Medical History:  Diagnosis Date  . Diabetes mellitus without complication ([place])    type 2  . History of seizure   . Hyperlipidemia   . Hypertension     Past Surgical History:  Procedure Laterality Date  . SHOULDER HEMI-ARTHROPLASTY Right 03/03/2019   Procedure: SHOULDER HEMI-ARTHROPLASTY;  Surgeon: Nicholes Stairs, MD;  Location: [facility];  Service: Orthopedics;  Laterality: Right;    There were no vitals filed for this visit.  Subjective Assessment - 04/20/19 0957    Subjective  Patient reports his shoulder still is very painful and the medications hurt his stomach. He has trouble with exercises at home.    Currently in Pain?  Yes    Pain Score  7     Pain Location  Shoulder    Pain Orientation  Right    Pain Descriptors / Indicators  Aching;Tightness;Throbbing    Pain Type  Surgical pain    Pain Onset  More than a month ago    Pain Frequency  Constant         [facility] PT Assessment - 04/20/19 0001      PROM   Right Shoulder Flexion  98 Degrees    Right Shoulder External Rotation  8 Degrees                   [facility] Adult PT Treatment/Exercise - 04/20/19 0001      Shoulder Exercises: Supine   External Rotation  PROM;10 reps    External Rotation Limitations  dowel    Flexion  10 reps    Flexion Limitations  Dowel assisted chest press      Shoulder Exercises: Standing   External Rotation  PROM;10 reps    External Rotation Limitations  right arm pinned with dowel stretching, also low ER in door    Flexion  20 reps;AAROM    Flexion Limitations  forward table slide    Retraction  20 reps      Vasopneumatic   Number Minutes Vasopneumatic   10 minutes    Vasopnuematic Location   Shoulder    Vasopneumatic Pressure  Medium    Vasopneumatic Temperature   34      Manual Therapy   Manual Therapy  Passive ROM;Soft tissue mobilization    Soft tissue mobilization  anterior deltoid region    Passive ROM  Shoulder elevation, ER, IR in scapular plane             PT Education - 04/20/19 0958    Education Details  HEP    Person(s) Educated  Patient    Methods  Explanation;Demonstration;Verbal cues;Handout;Tactile cues    Comprehension  Verbalized  understanding;Returned demonstration;Verbal cues required;Tactile cues required;Need further instruction       PT Short Term Goals - 04/20/19 1011      PT SHORT TERM GOAL #1   Title  Patient will be indepdnent with initial HEP to progress range of motion.    Time  2    Period  Weeks    Status  Partially Met    Target Date  04/04/19      PT SHORT TERM GOAL #2   Title  Patient will report < or  = 3-4/10 with exercise in order to improve activity tolerance and allow progression    Time  3    Period  Weeks    Status  On-going    Target Date  04/11/19      PT SHORT TERM GOAL #3   Title  Patient will achieve right shoulder PROM 90 deg elevation and 30 deg ER to reach milestones.    Time  3    Period  Weeks    Status  Partially Met    Target Date  04/11/19        PT Long Term Goals - 03/22/19 0755      PT LONG TERM GOAL #1   Title  Patient will report < or = 2-3/10 pain level with activity to allow for improved functional level.    Time  6    Period  Weeks    Status  New    Target Date   05/02/19      PT LONG TERM GOAL #2   Title  Patient will report improved functional level by FOTO < or = 55%.    Time  6    Period  Weeks    Status  New    Target Date  05/02/19      PT LONG TERM GOAL #3   Title  Patient will be progressing with active range of motion and will achieve PROM [facility].    Time  6    Period  Weeks    Status  New    Target Date  05/02/19      PT LONG TERM GOAL #4   Title  Establish new long term goals at 6 week assessment.            Plan - 04/20/19 1007    Clinical Impression Statement  Patient contnues to be limited with PROM due to stiffness and pain level. He has trouble doing his exercises at home due to pain but he was encouraged to work on stretching at home 4-5x/day in small amounts. He would benefit from continued skilled PT to porgress his motion and initiaition active and strengthening as able.    PT Treatment/Interventions  ADLs/Self Care Home Management;Cryotherapy;Electrical Stimulation;Moist Heat;Therapeutic activities;Therapeutic exercise;Neuromuscular re-education;Patient/family education;Manual techniques;Dry needling;Passive range of motion;Scar mobilization;Taping;Joint Manipulations;Spinal Manipulations    PT Next Visit Plan  Assess HEP and ensure proper sling wear/posture; progress PROM shoulder flexion, IR, and ER <30 deg all in scap plane; deltoid isometrics, elbow/wrist/hand AROM    PT Home Exercise Plan  Seated or standing forward table slide shoulder PROM, supine and standing shoulder ER PROM with dowel or doorframe, standing supported pendulum circles, seated scap squeezes    Consulted and Agree with Plan of Care  Patient       Patient will benefit from skilled therapeutic intervention in order to improve the following deficits and impairments:  Decreased range of motion, Pain, Postural dysfunction, Impaired UE functional use, Decreased strength,  Increased muscle spasms, Decreased knowledge of precautions, Increased edema  Visit  Diagnosis: Acute pain of right shoulder  Stiffness of right shoulder, not elsewhere classified  Muscle weakness (generalized)  Localized edema     Problem List Patient Active Problem List   Diagnosis Date Noted  . 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 Hypertension 01/14/2019  . Acute metabolic encephalopathy 16/24/4695  . DKA (diabetic ketoacidoses) ([place]) 01/13/2019  . Diabetes mellitus without complication ([place])   . History of seizure   . Posterior dislocation of left shoulder joint   . Posterior dislocation of right shoulder joint     Hilda Blades, PT, DPT, LAT, ATC 04/20/19  10:20 AM Phone: [phone] Fax: [facility] [address] [place], [place], [address] Phone: [phone]   Fax:  [phone]  Name: Alan Alvarez MRN: [id number] Date of Birth: February 20, 1981

## 2019-04-26 ENCOUNTER — Other Ambulatory Visit: Payer: Self-pay

## 2019-04-26 ENCOUNTER — Encounter: Payer: Self-pay | Admitting: Physical Therapy

## 2019-04-26 ENCOUNTER — Ambulatory Visit: Payer: Self-pay | Admitting: Neurology

## 2019-04-26 ENCOUNTER — Encounter: Payer: Self-pay | Admitting: Neurology

## 2019-04-26 ENCOUNTER — Ambulatory Visit: Payer: Self-pay | Attending: Orthopedic Surgery | Admitting: Physical Therapy

## 2019-04-26 VITALS — BP 131/86 | HR 91 | Temp 97.6°F | Ht 64.0 in | Wt 163.4 lb

## 2019-04-26 DIAGNOSIS — R600 Localized edema: Secondary | ICD-10-CM | POA: Insufficient documentation

## 2019-04-26 DIAGNOSIS — M6281 Muscle weakness (generalized): Secondary | ICD-10-CM | POA: Insufficient documentation

## 2019-04-26 DIAGNOSIS — M25611 Stiffness of right shoulder, not elsewhere classified: Secondary | ICD-10-CM | POA: Insufficient documentation

## 2019-04-26 DIAGNOSIS — M25511 Pain in right shoulder: Secondary | ICD-10-CM | POA: Insufficient documentation

## 2019-04-26 DIAGNOSIS — G40309 Generalized idiopathic epilepsy and epileptic syndromes, not intractable, without status epilepticus: Secondary | ICD-10-CM

## 2019-04-26 MED ORDER — DIVALPROEX SODIUM 250 MG PO DR TAB: DELAYED_RELEASE_TABLET | ORAL | 11 refills | Status: AC

## 2019-04-26 NOTE — Patient Instructions (Signed)
1. Take the Divalproex 250mg : 1 tablet in morning, 2 tablets at bedtime  2. Discuss sleep and pain with your regular doctor  3. Follow-up in 3-4 months, call for any changes  Seizure Precautions: 1. If medication has been prescribed for you to prevent seizures, take it exactly as directed.  Do not stop taking the medicine without talking to your doctor first, even if you have not had a seizure in a long time.   2. Avoid activities in which a seizure would cause danger to yourself or to others.  Don't operate dangerous machinery, swim alone, or climb in high or dangerous places, such as on ladders, roofs, or girders.  Do not drive unless your doctor says you may.  3. If you have any warning that you may have a seizure, lay down in a safe place where you can't hurt yourself.    4.  No driving for 6 months from last seizure, as per [facility].   Please refer to the following link on the [employer]'s website for more information: http://www.epilepsyfoundation.org/answerplace/Social/driving/drivingu.cfm   5.  Maintain good sleep hygiene. Avoid alcohol.  6.  Contact your doctor if you have any problems that may be related to the medicine you are taking.  7.  Call 911 and bring the patient back to the ED if:        A.  The seizure lasts longer than 5 minutes.       B.  The patient doesn't awaken shortly after the seizure  C.  The patient has new problems such as difficulty seeing, speaking or moving  D.  The patient was injured during the seizure  E.  The patient has a temperature over 102 F (39C)  F.  The patient vomited and now is having trouble breathing
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
## 2019-04-26 NOTE — Therapy (Signed)
[place], [place], [address] Phone: [phone]   Fax:  [phone]  Physical Therapy Treatment  Patient Details  Name: Alan Alvarez MRN: [id number] Date of Birth: [birth year] Referring Provider (PT): Nicholes Stairs, MD   Encounter Date: 04/26/2019  PT End of Session - 04/26/19 0719    Visit Number  9    Number of Visits  12    Date for PT Re-Evaluation  05/02/19    Authorization Type  Self pay    PT Start Time  0715    PT Stop Time  0815    PT Time Calculation (min)  60 min       Past Medical History:  Diagnosis Date  . Diabetes mellitus without complication ([place])    type 2  . History of seizure   . Hyperlipidemia   . Hypertension     Past Surgical History:  Procedure Laterality Date  . SHOULDER HEMI-ARTHROPLASTY Right 03/03/2019   Procedure: SHOULDER HEMI-ARTHROPLASTY;  Surgeon: Nicholes Stairs, MD;  Location: [facility];  Service: Orthopedics;  Laterality: Right;    There were no vitals filed for this visit.  Subjective Assessment - 04/26/19 0718    Subjective  I am doing my  exercises more but it hurts.    Currently in Pain?  Yes    Pain Score  6     Pain Location  Shoulder    Pain Orientation  Right    Pain Descriptors / Indicators  Aching;Throbbing;Tightness    Pain Type  Surgical pain    Pain Onset  More than a month ago    Pain Frequency  Constant    Aggravating Factors   constant, exercises    Pain Relieving Factors  ice, meds         [facility] PT Assessment - 04/26/19 0001      PROM   Right Shoulder Flexion  105 Degrees    Right Shoulder External Rotation  12 Degrees                   [facility] Adult PT Treatment/Exercise - 04/26/19 0001      Shoulder Exercises: Seated   Elevation Limitations  forward table slide x10 sec, use of left arm for assist    External Rotation Limitations  using table to support arm    also low ER in doorway for passive stretch       Shoulder Exercises: Standing   External Rotation  PROM;10 reps    External Rotation Limitations  right arm pinned with UE ranger  stretching, also low ER in door    Retraction  20 reps    Other Standing Exercises  Standing UE ranger flexion, scaption AAROM from floor appor 75 degrees focusing on scap position    Other Standing Exercises  shoulder rolls, 1# elbow flexion, long arm IR/ER AROM      Shoulder Exercises: Pulleys   Flexion Limitations  10 reps, tolerated well      Shoulder Exercises: ROM/Strengthening   Other ROM/Strengthening Exercises  standing roll phyioball on table into flexion and small IR/ER rom       Modalities   Modalities  Moist Heat      Moist Heat Therapy   Number Minutes Moist Heat  15 Minutes    Moist Heat Location  Shoulder   right     Manual Therapy   Soft tissue mobilization  anterior and posteriolateral shoulder     Passive  ROM  Shoulder elevation, ER, IR in scapular plane               PT Short Term Goals - 04/20/19 1011      PT SHORT TERM GOAL #1   Title  Patient will be indepdnent with initial HEP to progress range of motion.    Time  2    Period  Weeks    Status  Partially Met    Target Date  04/04/19      PT SHORT TERM GOAL #2   Title  Patient will report < or  = 3-4/10 with exercise in order to improve activity tolerance and allow progression    Time  3    Period  Weeks    Status  On-going    Target Date  04/11/19      PT SHORT TERM GOAL #3   Title  Patient will achieve right shoulder PROM 90 deg elevation and 30 deg ER to reach milestones.    Time  3    Period  Weeks    Status  Partially Met    Target Date  04/11/19        PT Long Term Goals - 03/22/19 0755      PT LONG TERM GOAL #1   Title  Patient will report < or = 2-3/10 pain level with activity to allow for improved functional level.    Time  6    Period  Weeks    Status  New    Target Date  05/02/19      PT LONG TERM GOAL #2   Title  Patient will  report improved functional level by FOTO < or = 55%.    Time  6    Period  Weeks    Status  New    Target Date  05/02/19      PT LONG TERM GOAL #3   Title  Patient will be progressing with active range of motion and will achieve PROM [facility].    Time  6    Period  Weeks    Status  New    Target Date  05/02/19      PT LONG TERM GOAL #4   Title  Establish new long term goals at 6 week assessment.            Plan - 04/26/19 0801    Clinical Impression Statement  Progress noted in PROM for flexion and ER. Tolerated light AAROM well today. Trial of HMP to reduce tightness.    PT Next Visit Plan  Assess HEP and ensure proper sling wear/posture; progress PROM shoulder flexion, IR, and ER <30 deg all in scap plane; deltoid isometrics, elbow/wrist/hand AROM    PT Home Exercise Plan  Seated or standing forward table slide shoulder PROM, supine and standing shoulder ER PROM with dowel or doorframe, standing supported pendulum circles, seated scap squeezes       Patient will benefit from skilled therapeutic intervention in order to improve the following deficits and impairments:  Decreased range of motion, Pain, Postural dysfunction, Impaired UE functional use, Decreased strength, Increased muscle spasms, Decreased knowledge of precautions, Increased edema  Visit Diagnosis: Acute pain of right shoulder  Stiffness of right shoulder, not elsewhere classified  Muscle weakness (generalized)  Localized edema     Problem List Patient Active Problem List   Diagnosis Date Noted  . 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 Hypertension 01/14/2019  . Acute metabolic encephalopathy 20/01/711  .  DKA (diabetic ketoacidoses) ([place]) 01/13/2019  . Diabetes mellitus without complication ([place])   . History of seizure   . Posterior dislocation of left shoulder joint   . Posterior dislocation of right shoulder joint     [name], PTA 04/26/2019, 8:04 AM  [place], [place], [address] Phone: [phone]   Fax:  [phone]  Name: Alan Alvarez MRN: [id number] Date of Birth: 03/23/1981

## 2019-04-29 ENCOUNTER — Ambulatory Visit: Payer: Self-pay | Admitting: Physical Therapy

## 2019-05-02 ENCOUNTER — Ambulatory Visit: Payer: Self-pay | Admitting: Physical Therapy

## 2019-05-02 ENCOUNTER — Other Ambulatory Visit: Payer: Self-pay

## 2019-05-02 DIAGNOSIS — R600 Localized edema: Secondary | ICD-10-CM

## 2019-05-02 DIAGNOSIS — M25511 Pain in right shoulder: Secondary | ICD-10-CM

## 2019-05-02 DIAGNOSIS — M25611 Stiffness of right shoulder, not elsewhere classified: Secondary | ICD-10-CM

## 2019-05-02 DIAGNOSIS — M6281 Muscle weakness (generalized): Secondary | ICD-10-CM

## 2019-05-02 NOTE — Therapy (Signed)
Alan Alvarez, [place], [address] Phone: [phone]   Fax:  [phone]  Physical Therapy Treatment  Patient Details  Name: Alan Alvarez MRN: [id number] Date of Birth: 15-Dec-1980 Referring Provider (PT): Nicholes Stairs, MD   Encounter Date: 05/02/2019  PT End of Session - 05/02/19 0814    Visit Number  10    Number of Visits  12    Date for PT Re-Evaluation  05/02/19    Authorization Type  Self pay    PT Start Time  0800    PT Stop Time  3662    PT Time Calculation (min)  55 min       Past Medical History:  Diagnosis Date  . Diabetes mellitus without complication ([place])    type 2  . History of seizure   . Hyperlipidemia   . Hypertension     Past Surgical History:  Procedure Laterality Date  . SHOULDER HEMI-ARTHROPLASTY Right 03/03/2019   Procedure: SHOULDER HEMI-ARTHROPLASTY;  Surgeon: Nicholes Stairs, MD;  Location: [facility];  Service: Orthopedics;  Laterality: Right;    There were no vitals filed for this visit.      [facility] PT Assessment - 05/02/19 0001      PROM   Right Shoulder Flexion  110 Degrees    Right Shoulder External Rotation  20 Degrees                   [facility] Adult PT Treatment/Exercise - 05/02/19 0001      Shoulder Exercises: Supine   Flexion  10 reps    Flexion Limitations  Dowel assisted chest press      Shoulder Exercises: Standing   Flexion  20 reps;AAROM    Flexion Limitations  forward table ball roll and hold stretch    Retraction  20 reps    Other Standing Exercises  Standing UE ranger flexion, scaption AAROM from floor appor 85 degrees focusing on scap position, standing cane /UE ranger ER AAROM    Other Standing Exercises  shoulder rolls, 1# elbow flexion, long arm IR/ER AROM      Shoulder Exercises: Pulleys   Flexion  2 minutes      Shoulder Exercises: ROM/Strengthening   Other ROM/Strengthening Exercises  wall ladder climb x 5     Other ROM/Strengthening Exercises  standing roll phyioball on table into flexion and small IR/ER rom       Shoulder Exercises: Stretch   External Rotation Stretch  3 reps;20 seconds   low arm in doorway      Moist Heat Therapy   Number Minutes Moist Heat  15 Minutes    Moist Heat Location  Shoulder      Manual Therapy   Passive ROM  Shoulder elevation, ER, IR in scapular plane               PT Short Term Goals - 04/20/19 1011      PT SHORT TERM GOAL #1   Title  Patient will be indepdnent with initial HEP to progress range of motion.    Time  2    Period  Weeks    Status  Partially Met    Target Date  04/04/19      PT SHORT TERM GOAL #2   Title  Patient will report < or  = 3-4/10 with exercise in order to improve activity tolerance and allow progression    Time  3    Period  Weeks    Status  On-going    Target Date  04/11/19      PT SHORT TERM GOAL #3   Title  Patient will achieve right shoulder PROM 90 deg elevation and 30 deg ER to reach milestones.    Time  3    Period  Weeks    Status  Partially Met    Target Date  04/11/19        PT Long Term Goals - 03/22/19 0755      PT LONG TERM GOAL #1   Title  Patient will report < or = 2-3/10 pain level with activity to allow for improved functional level.    Time  6    Period  Weeks    Status  New    Target Date  05/02/19      PT LONG TERM GOAL #2   Title  Patient will report improved functional level by FOTO < or = 55%.    Time  6    Period  Weeks    Status  New    Target Date  05/02/19      PT LONG TERM GOAL #3   Title  Patient will be progressing with active range of motion and will achieve PROM [facility].    Time  6    Period  Weeks    Status  New    Target Date  05/02/19      PT LONG TERM GOAL #4   Title  Establish new long term goals at 6 week assessment.            Plan - 05/02/19 0845    Clinical Impression Statement  Continued improvement noted in ER and Flexion PROM. He admits to HEP 1  x per day and was encouraged to increase to 3 x per day.    PT Next Visit Plan  Assess HEP and ensure proper sling wear/posture; progress PROM shoulder flexion, IR, and ER <30 deg all in scap plane; deltoid isometrics, elbow/wrist/hand AROM    PT Home Exercise Plan  Seated or standing forward table slide shoulder PROM, supine and standing shoulder ER PROM with dowel or doorframe, standing supported pendulum circles, seated scap squeezes       Patient will benefit from skilled therapeutic intervention in order to improve the following deficits and impairments:  Decreased range of motion, Pain, Postural dysfunction, Impaired UE functional use, Decreased strength, Increased muscle spasms, Decreased knowledge of precautions, Increased edema  Visit Diagnosis: Acute pain of right shoulder  Stiffness of right shoulder, not elsewhere classified  Muscle weakness (generalized)  Localized edema     Problem List Patient Active Problem List   Diagnosis Date Noted  . 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 Hypertension 01/14/2019  . Acute metabolic encephalopathy 38/46/6599  . DKA (diabetic ketoacidoses) ([place]) 01/13/2019  . Diabetes mellitus without complication ([place])   . History of seizure   . Posterior dislocation of left shoulder joint   . Posterior dislocation of right shoulder joint     [name], PTA 05/02/2019, 9:33 AM  [place], [place], [address] Phone: [phone]   Fax:  [phone]  Name: Alan Alvarez MRN: [id number] Date of Birth: 1981/03/01

## 2019-05-04 ENCOUNTER — Other Ambulatory Visit: Payer: Self-pay

## 2019-05-04 ENCOUNTER — Ambulatory Visit: Payer: Self-pay | Admitting: Physical Therapy

## 2019-05-04 ENCOUNTER — Encounter: Payer: Self-pay | Admitting: Physical Therapy

## 2019-05-04 DIAGNOSIS — R600 Localized edema: Secondary | ICD-10-CM

## 2019-05-04 DIAGNOSIS — M25511 Pain in right shoulder: Secondary | ICD-10-CM

## 2019-05-04 DIAGNOSIS — M25611 Stiffness of right shoulder, not elsewhere classified: Secondary | ICD-10-CM

## 2019-05-04 DIAGNOSIS — M6281 Muscle weakness (generalized): Secondary | ICD-10-CM

## 2019-05-04 NOTE — Patient Instructions (Signed)
Access Code: [email or web address]  URL: [email or web address]  Date: 05/04/2019  Prepared by: Rosana Hoes   Exercises Standing Single Arm Shoulder Flexion Towel Slide at Table Top - 10 reps - 10-20 seconds hold - 3-4x daily - 7x weekly Standing Shoulder External Rotation Stretch in Doorway - 10 reps - 10-20 seconds hold - 3-4x daily - 7x weekly Supine Shoulder Flexion Extension AAROM with Dowel - 10 reps - 3-4x daily - 7x weekly

## 2019-05-04 NOTE — Therapy (Signed)
[facility] Outpatient Rehabilitation [facility] [address] [place], [place], [address] Phone: [phone]   Fax:  [phone]  Physical Therapy Treatment    Progress Note Reporting Period 03/21/2019 to 05/04/2019  See note below for Objective Data and Assessment of Progress/Goals.    Patient Details  Name: Alan Alvarez MRN: [id number] Date of Birth: 1980-07-10 Referring Provider (PT): Yolonda Kida, MD   Encounter Date: 05/04/2019  PT End of Session - 05/04/19 0903    Visit Number  11    Number of Visits  24    Date for PT Re-Evaluation  06/15/19    Authorization Type  Self pay    PT Start Time  0830    PT Stop Time  0915    PT Time Calculation (min)  45 min    Activity Tolerance  Patient limited by pain    Behavior During Therapy  [facility] for tasks assessed/performed       Past Medical History:  Diagnosis Date  . Diabetes mellitus without complication (HCC)    type 2  . History of seizure   . Hyperlipidemia   . Hypertension     Past Surgical History:  Procedure Laterality Date  . SHOULDER HEMI-ARTHROPLASTY Right 03/03/2019   Procedure: SHOULDER HEMI-ARTHROPLASTY;  Surgeon: Yolonda Kida, MD;  Location: [facility] OR;  Service: Orthopedics;  Laterality: Right;    There were no vitals filed for this visit.  Subjective Assessment - 05/04/19 0900    Subjective  Patient reports he continues to have pain with exercises but he tries to do them every day. His shoulder continues to feel weak and he is unable to lift it.    Patient Stated Goals  Be able to use right shoulder overhead and for activity to return to previous level of function.    Currently in Pain?  Yes    Pain Score  4     Pain Location  Shoulder    Pain Orientation  Right    Pain Descriptors / Indicators  Aching;Throbbing;Tightness    Pain Type  Surgical pain    Pain Onset  More than a month ago    Pain Frequency  Intermittent    Aggravating Factors   exercises, movement    Pain  Relieving Factors  ice, medication, rest         [facility] PT Assessment - 05/04/19 0001      Assessment   Medical Diagnosis  s/p right shoulder hemiarthroplasty    Referring Provider (PT)  Yolonda Kida, MD    Onset Date/Surgical Date  03/03/19    Hand Dominance  Right;Left    Next MD Visit  Patient reports ~6 months    Prior Therapy  None      Precautions   Precautions  Shoulder    Type of Shoulder Precautions  ~9 weeks s/p right TSA    Precaution Comments  Avoid lifting > 5 lbs or weight bearing with right shoulder      Observation/Other Assessments   Observations  Patient contiues to carry right UE in guarded positions and is hesitant to use it with tasks such as donning/doffing jacket      Posture/Postural Control   Posture Comments  Rounded shoulder posture      AROM   Right/Left Shoulder  Right    Right Shoulder Flexion  30 Degrees   significant shrug sign   Right Shoulder ABduction  30 Degrees   significant shrug sign   Right Shoulder Internal  Rotation  --   Function reach behind back to PSIS   Left Shoulder Flexion  100 Degrees    Left Shoulder ABduction  90 Degrees    Left Shoulder Internal Rotation  --   Function reach behind back to L5   Left Shoulder External Rotation  30 Degrees      PROM   Right Shoulder Flexion  110 Degrees    Right Shoulder Internal Rotation  50 Degrees   at 30 deg abd   Right Shoulder External Rotation  20 Degrees   at 30 deg abd                  [facility] Adult PT Treatment/Exercise - 05/04/19 0001      Exercises   Exercises  Shoulder      Shoulder Exercises: Supine   Flexion  10 reps    Flexion Limitations  dowel assisted with elbows straight      Shoulder Exercises: Standing   Flexion  20 reps;AAROM    Flexion Limitations  forward table slide    Row  10 reps   2 sets   Theraband Level (Shoulder Row)  Level 1 (Yellow)    Row Limitations  VC for technique to avoid shrug    Other Standing Exercises   Standing UE ranger from floor scaption x10 - require assistance and max cueing for scap positioning      Shoulder Exercises: Pulleys   Scaption  3 minutes      Shoulder Exercises: Stretch   External Rotation Stretch  3 reps;20 seconds   doorway     Manual Therapy   Manual Therapy  Passive ROM;Soft tissue mobilization;Joint mobilization    Joint Mobilization  Grade I-II GHJ mobilizations    Soft tissue mobilization  anterior and posteriolateral shoulder     Passive ROM  Shoulder elevation, ER, IR in scapular plane             PT Education - 05/04/19 0902    Education Details  HEP, continued stretching    Person(s) Educated  Patient    Methods  Explanation;Demonstration;Tactile cues;Verbal cues;Handout    Comprehension  Verbalized understanding;Returned demonstration;Verbal cues required;Tactile cues required;Need further instruction       PT Short Term Goals - 05/04/19 0944      PT SHORT TERM GOAL #1   Title  Patient will be indepdnent with initial HEP to progress range of motion.    Time  3    Period  Weeks    Status  Revised    Target Date  05/25/19      PT SHORT TERM GOAL #2   Title  Patient will report < or  = 3-4/10 with exercise in order to improve activity tolerance and allow progression    Time  3    Period  Weeks    Status  Revised    Target Date  05/25/19      PT SHORT TERM GOAL #3   Title  Patient will achieve right shoulder PROM 120 deg elevation and 30 deg ER to reach milestones.    Time  3    Period  Weeks    Status  Revised    Target Date  05/25/19        PT Long Term Goals - 05/04/19 0945      PT LONG TERM GOAL #1   Title  Patient will report < or = 2-3/10 pain level with activity to allow for improved functional  level.    Time  6    Period  Weeks    Status  Revised    Target Date  06/15/19      PT LONG TERM GOAL #2   Title  Patient will report improved functional level by FOTO < or = 55%.    Time  6    Period  Weeks    Status   Revised    Target Date  06/15/19      PT LONG TERM GOAL #3   Title  Patient will exhibit 90 deg AROM to improve dressing and grooming ability.    Time  6    Period  Weeks    Status  Revised    Target Date  06/15/19      PT LONG TERM GOAL #4   Title  Establish new long term goals at 6 week assessment.            Plan - 05/04/19 0904    Clinical Impression Statement  Patient continues to be significantly limited in his right shoulder passive motion and is unable to lift the arm against gravity at this point. He seems to still be limited in performing HEP due to pain level with gentle stretching and AAROM. Patient also continues to hold his arm in a guarded position which is most likely contributing to his symptoms. The patient does also exhibit a limitation in left shoulder active motion. His HEP was adjusted this visit to focus on stretching and AAROM for elevation in supine as patient is unable to perform AROM at this time against gravity. At this point he is not progressing as expected but he has had significant pain throughout the rehab process which has limited his progression. He would benefit from continued skilled PT to progress his right shoulder motion as tolerated to return to prior level of function.    Personal Factors and Comorbidities  Past/Current Experience;Social Background;Finances;Comorbidity 2    Comorbidities  DM, seizures    Examination-Activity Limitations  Bathing;Dressing;Hygiene/Grooming;Sleep;Lift;Caring for Others;Carry;Reach Overhead    Examination-Participation Restrictions  Community Activity;Driving;Laundry;Cleaning;Yard Work;Meal Prep    PT Frequency  2x / week    PT Duration  6 weeks    PT Treatment/Interventions  ADLs/Self Care Home Management;Cryotherapy;Electrical Stimulation;Moist Heat;Therapeutic activities;Therapeutic exercise;Neuromuscular re-education;Patient/family education;Manual techniques;Dry needling;Passive range of motion;Scar  mobilization;Taping;Joint Manipulations;Spinal Manipulations    PT Next Visit Plan  Assess HEP and ensure proper sling wear/posture; progress PROM shoulder flexion, IR, and ER <30 deg all in scap plane; deltoid isometrics, elbow/wrist/hand AROM    PT Home Exercise Plan  Standing forward table slide shoulder AAROM, standing ER doorframe stretch, supine shoulder elevation AAROM with dowel    Consulted and Agree with Plan of Care  Patient       Patient will benefit from skilled therapeutic intervention in order to improve the following deficits and impairments:  Decreased range of motion, Pain, Postural dysfunction, Impaired UE functional use, Decreased strength, Increased muscle spasms, Decreased knowledge of precautions, Increased edema  Visit Diagnosis: Acute pain of right shoulder  Stiffness of right shoulder, not elsewhere classified  Muscle weakness (generalized)  Localized edema     Problem List Patient Active Problem List   Diagnosis Date Noted  . 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 Hypertension 01/14/2019  . Acute metabolic encephalopathy 01/14/2019  . DKA (diabetic ketoacidoses) (HCC) 01/13/2019  . Diabetes mellitus without complication (HCC)   . History of seizure   . Posterior dislocation of left shoulder joint   .  Posterior dislocation of right shoulder joint     Hilda Blades, PT, DPT, LAT, ATC 05/04/19  9:55 AM Phone: [phone] Fax: [facility] [address] [place], [place], [address] Phone: [phone]   Fax:  [phone]  Name: Alan Alvarez MRN: [id number] Date of Birth: [birth year]

## 2019-05-09 ENCOUNTER — Ambulatory Visit: Payer: Self-pay | Admitting: Physical Therapy

## 2019-05-11 ENCOUNTER — Ambulatory Visit: Payer: Self-pay | Admitting: Physical Therapy

## 2019-05-16 ENCOUNTER — Other Ambulatory Visit: Payer: Self-pay

## 2019-05-16 ENCOUNTER — Encounter: Payer: Self-pay | Admitting: Physical Therapy

## 2019-05-16 ENCOUNTER — Ambulatory Visit: Payer: Self-pay | Admitting: Physical Therapy

## 2019-05-16 DIAGNOSIS — M25511 Pain in right shoulder: Secondary | ICD-10-CM

## 2019-05-16 DIAGNOSIS — M25611 Stiffness of right shoulder, not elsewhere classified: Secondary | ICD-10-CM

## 2019-05-16 DIAGNOSIS — M6281 Muscle weakness (generalized): Secondary | ICD-10-CM

## 2019-05-16 DIAGNOSIS — R600 Localized edema: Secondary | ICD-10-CM

## 2019-05-16 NOTE — Therapy (Signed)
[facility] Out[facility] [address] [place], [place], [address] Phone: [phone]   Fax:  [phone]  Physical Therapy Treatment  Patient Details  Name: Alan Alvarez MRN: [id number] Date of Birth: 18-May-1980 Referring Provider (PT): Yolonda Kida, MD   Encounter Date: 05/16/2019  PT End of Session - 05/16/19 0915    Visit Number  12    Number of Visits  24    Date for PT Re-Evaluation  06/15/19    Authorization Type  Self pay    PT Start Time  0907    PT Stop Time  0948    PT Time Calculation (min)  41 min    Activity Tolerance  Patient limited by pain    Behavior During Therapy  [facility] for tasks assessed/performed       Past Medical History:  Diagnosis Date  . Diabetes mellitus without complication (HCC)    type 2  . History of seizure   . Hyperlipidemia   . Hypertension     Past Surgical History:  Procedure Laterality Date  . SHOULDER HEMI-ARTHROPLASTY Right 03/03/2019   Procedure: SHOULDER HEMI-ARTHROPLASTY;  Surgeon: Yolonda Kida, MD;  Location: [facility] OR;  Service: Orthopedics;  Laterality: Right;    There were no vitals filed for this visit.  Subjective Assessment - 05/16/19 0916    Subjective  Patient reports shoulder is not too painful today, but he did not come to his appointments last week due to severe pain. He reports that he is still limited with doing his exercises and lifting his arm due to pain.    Patient Stated Goals  Be able to use right shoulder overhead and for activity to return to previous level of function.    Currently in Pain?  Yes    Pain Score  6     Pain Location  Shoulder    Pain Orientation  Right    Pain Descriptors / Indicators  Aching;Throbbing;Tingling;Sharp    Pain Type  Surgical pain;Chronic pain    Pain Onset  More than a month ago    Pain Frequency  Intermittent         [facility] PT Assessment - 05/16/19 0001      Assessment   Onset Date/Surgical Date  03/03/19      PROM   Right Shoulder Flexion  110 Degrees    Right Shoulder External Rotation  20 Degrees    Left Shoulder Flexion  110 Degrees    Left Shoulder External Rotation  20 Degrees                   [facility] Adult PT Treatment/Exercise - 05/16/19 0001      Exercises   Exercises  Shoulder      Shoulder Exercises: Supine   Flexion Limitations  x10 chest press, 2x8 straight arm flexion AROM      Shoulder Exercises: Seated   Retraction  20 reps      Shoulder Exercises: Standing   Flexion Limitations  Wall ball 2x5    Row  20 reps    Theraband Level (Shoulder Row)  Level 1 (Yellow)    Row Limitations  VC for technique to avoid shrug      Manual Therapy   Manual Therapy  Passive ROM;Soft tissue mobilization;Joint mobilization    Joint Mobilization  Grade I-II GHJ mobilizations to reduce guarding    Soft tissue mobilization  Deltoid region    Passive ROM  Shoulder elevation, ER, IR in  scapular plane             PT Education - 05/16/19 0914    Education Details  HEP    Person(s) Educated  Patient    Methods  Explanation;Demonstration;Tactile cues;Verbal cues;Handout    Comprehension  Returned demonstration;Verbalized understanding;Verbal cues required;Tactile cues required;Need further instruction       PT Short Term Goals - 05/04/19 0944      PT SHORT TERM GOAL #1   Title  Patient will be indepdnent with initial HEP to progress range of motion.    Time  3    Period  Weeks    Status  Revised    Target Date  05/25/19      PT SHORT TERM GOAL #2   Title  Patient will report < or  = 3-4/10 with exercise in order to improve activity tolerance and allow progression    Time  3    Period  Weeks    Status  Revised    Target Date  05/25/19      PT SHORT TERM GOAL #3   Title  Patient will achieve right shoulder PROM 120 deg elevation and 30 deg ER to reach milestones.    Time  3    Period  Weeks    Status  Revised    Target Date  05/25/19        PT Long Term  Goals - 05/04/19 0945      PT LONG TERM GOAL #1   Title  Patient will report < or = 2-3/10 pain level with activity to allow for improved functional level.    Time  6    Period  Weeks    Status  Revised    Target Date  06/15/19      PT LONG TERM GOAL #2   Title  Patient will report improved functional level by FOTO < or = 55%.    Time  6    Period  Weeks    Status  Revised    Target Date  06/15/19      PT LONG TERM GOAL #3   Title  Patient will exhibit 90 deg AROM to improve dressing and grooming ability.    Time  6    Period  Weeks    Status  Revised    Target Date  06/15/19      PT LONG TERM GOAL #4   Title  Establish new long term goals at 6 week assessment.            Plan - 05/16/19 0927    Clinical Impression Statement  Patient continues to be limited by pain with his exercises and progression. He missed last week's appointments due to increased pain level. Todays visit focused on gentle stretching and supine or assisted motion to avoid further shoulder pain and aggravation. When supine his right shoulder elevation AROM is equal to PROM. His left shoulder also exhibits limitation in passive motion so its unclear how much more progress he will make with his motion. He would benefit from continued skilled PT to progress his active motion so he can improve raising arm against gravity.    PT Treatment/Interventions  ADLs/Self Care Home Management;Cryotherapy;Electrical Stimulation;Moist Heat;Therapeutic activities;Therapeutic exercise;Neuromuscular re-education;Patient/family education;Manual techniques;Dry needling;Passive range of motion;Scar mobilization;Taping;Joint Manipulations;Spinal Manipulations    PT Next Visit Plan  Assess HEP and ensure proper sling wear/posture; progress PROM shoulder flexion, IR, and ER <30 deg all in scap plane; deltoid isometrics, elbow/wrist/hand AROM  PT Home Exercise Plan  Standing forward table slide shoulder AAROM, standing ER doorframe  stretch, supine shoulder elevation AROM    Consulted and Agree with Plan of Care  Patient       Patient will benefit from skilled therapeutic intervention in order to improve the following deficits and impairments:  Decreased range of motion, Pain, Postural dysfunction, Impaired UE functional use, Decreased strength, Increased muscle spasms, Decreased knowledge of precautions, Increased edema  Visit Diagnosis: Acute pain of right shoulder  Stiffness of right shoulder, not elsewhere classified  Muscle weakness (generalized)  Localized edema     Problem List Patient Active Problem List   Diagnosis Date Noted  . 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 Hypertension 01/14/2019  . Acute metabolic encephalopathy 01/14/2019  . DKA (diabetic ketoacidoses) (HCC) 01/13/2019  . Diabetes mellitus without complication (HCC)   . History of seizure   . Posterior dislocation of left shoulder joint   . Posterior dislocation of right shoulder joint     Rosana Hoes, PT, DPT, LAT, ATC 05/16/19  10:06 AM Phone: [phone] Fax: [phone]   [facility] Outpatient Rehabilitation [facility] [address] [place], [place], [address] Phone: [phone]   Fax:  [phone]  Name: Alan Alvarez MRN: [id number] Date of Birth: 1980/11/28

## 2019-05-18 ENCOUNTER — Encounter: Payer: Self-pay | Admitting: Physical Therapy

## 2019-05-18 ENCOUNTER — Ambulatory Visit: Payer: Self-pay | Admitting: Physical Therapy

## 2019-05-18 ENCOUNTER — Other Ambulatory Visit: Payer: Self-pay

## 2019-05-18 DIAGNOSIS — R600 Localized edema: Secondary | ICD-10-CM

## 2019-05-18 DIAGNOSIS — M25511 Pain in right shoulder: Secondary | ICD-10-CM

## 2019-05-18 DIAGNOSIS — M6281 Muscle weakness (generalized): Secondary | ICD-10-CM

## 2019-05-18 DIAGNOSIS — M25611 Stiffness of right shoulder, not elsewhere classified: Secondary | ICD-10-CM

## 2019-05-18 NOTE — Therapy (Signed)
[facility] Outpatient Rehabilitation [facility] [address] [place], [place], [address] Phone: [phone]   Fax:  [phone]  Physical Therapy Treatment  Patient Details  Name: Alan Alvarez MRN: [id number] Date of Birth: 12/05/1980 Referring Provider (PT): Yolonda Kida, MD   Encounter Date: 05/18/2019  PT End of Session - 05/18/19 0812    Visit Number  13    Number of Visits  24    Date for PT Re-Evaluation  06/15/19    Authorization Type  Self pay    PT Start Time  0810    PT Stop Time  0905    PT Time Calculation (min)  55 min    Activity Tolerance  Patient limited by pain    Behavior During Therapy  [facility] for tasks assessed/performed       Past Medical History:  Diagnosis Date  . Diabetes mellitus without complication (HCC)    type 2  . History of seizure   . Hyperlipidemia   . Hypertension     Past Surgical History:  Procedure Laterality Date  . SHOULDER HEMI-ARTHROPLASTY Right 03/03/2019   Procedure: SHOULDER HEMI-ARTHROPLASTY;  Surgeon: Yolonda Kida, MD;  Location: [facility] OR;  Service: Orthopedics;  Laterality: Right;    There were no vitals filed for this visit.  Subjective Assessment - 05/18/19 0811    Subjective  Patient reports he didn't feel too bad after last visit but he is in pain today.    Patient Stated Goals  Be able to use right shoulder overhead and for activity to return to previous level of function.    Currently in Pain?  Yes    Pain Score  6     Pain Location  Shoulder    Pain Orientation  Right    Pain Descriptors / Indicators  Aching;Throbbing;Sharp    Pain Type  Chronic pain;Surgical pain    Pain Onset  More than a month ago    Pain Frequency  Constant         [facility] PT Assessment - 05/18/19 0001      Assessment   Medical Diagnosis  s/p right shoulder hemiarthroplasty    Referring Provider (PT)  Yolonda Kida, MD    Onset Date/Surgical Date  03/03/19                   [facility]  Adult PT Treatment/Exercise - 05/18/19 0001      Exercises   Exercises  Shoulder      Shoulder Exercises: Supine   Flexion Limitations  x10 chest press, 2x5 straight arm flexion AROM, 2x5 straight arm flexion AROM at 30 deg incline      Shoulder Exercises: Standing   Flexion Limitations  Wall ball forward roll up 2x5    Extension  20 reps    Theraband Level (Shoulder Extension)  Level 1 (Yellow)    Row  20 reps    Theraband Level (Shoulder Row)  Level 1 (Yellow)      Shoulder Exercises: Isometric Strengthening   External Rotation  5X5"    External Rotation Limitations  supine manual, 2 sets    Internal Rotation  5X5"    Internal Rotation Limitations  supine manual, 2 sets      Modalities   Modalities  Moist Heat   prior to exercise     Moist Heat Therapy   Number Minutes Moist Heat  10 Minutes    Moist Heat Location  Shoulder      Manual  Therapy   Manual Therapy  Passive ROM;Soft tissue mobilization;Joint mobilization    Joint Mobilization  Grade I-II GHJ mobilizations to reduce guarding    Soft tissue mobilization  Deltoid and bicep region    Passive ROM  Shoulder elevation, ER, IR in scapular plane             PT Education - 05/18/19 0812    Education Details  HEP    Person(s) Educated  Patient    Methods  Explanation;Demonstration;Tactile cues;Verbal cues    Comprehension  Verbalized understanding;Returned demonstration;Verbal cues required;Tactile cues required;Need further instruction       PT Short Term Goals - 05/04/19 0944      PT SHORT TERM GOAL #1   Title  Patient will be indepdnent with initial HEP to progress range of motion.    Time  3    Period  Weeks    Status  Revised    Target Date  05/25/19      PT SHORT TERM GOAL #2   Title  Patient will report < or  = 3-4/10 with exercise in order to improve activity tolerance and allow progression    Time  3    Period  Weeks    Status  Revised    Target Date  05/25/19      PT SHORT TERM GOAL #3    Title  Patient will achieve right shoulder PROM 120 deg elevation and 30 deg ER to reach milestones.    Time  3    Period  Weeks    Status  Revised    Target Date  05/25/19        PT Long Term Goals - 05/04/19 0945      PT LONG TERM GOAL #1   Title  Patient will report < or = 2-3/10 pain level with activity to allow for improved functional level.    Time  6    Period  Weeks    Status  Revised    Target Date  06/15/19      PT LONG TERM GOAL #2   Title  Patient will report improved functional level by FOTO < or = 55%.    Time  6    Period  Weeks    Status  Revised    Target Date  06/15/19      PT LONG TERM GOAL #3   Title  Patient will exhibit 90 deg AROM to improve dressing and grooming ability.    Time  6    Period  Weeks    Status  Revised    Target Date  06/15/19      PT LONG TERM GOAL #4   Title  Establish new long term goals at 6 week assessment.            Plan - 05/18/19 0812    Clinical Impression Statement  Patien toterated progression in AROM with increased incline for flexion and he was able to perform through entire available range. He continues to be limited with his motion bilaterally so focus is iproving strength within available range and ability to raise arm to shoulder height. He continues to exhibit increased pain with exercise but the use of heat prior helped per patient. He would benefit from continued skilled PT to progress motion and strength to improve right arm function.    PT Treatment/Interventions  ADLs/Self Care Home Management;Cryotherapy;Electrical Stimulation;Moist Heat;Therapeutic activities;Therapeutic exercise;Neuromuscular re-education;Patient/family education;Manual techniques;Dry needling;Passive range of motion;Scar mobilization;Taping;Joint Manipulations;Spinal Manipulations  PT Next Visit Plan  Assess HEP and ensure proper sling wear/posture; progress PROM shoulder flexion, IR, and ER <30 deg all in scap plane; deltoid  isometrics, elbow/wrist/hand AROM    PT Home Exercise Plan  Standing forward table slide shoulder AAROM, standing ER doorframe stretch, supine shoulder elevation AROM    Consulted and Agree with Plan of Care  Patient       Patient will benefit from skilled therapeutic intervention in order to improve the following deficits and impairments:  Decreased range of motion, Pain, Postural dysfunction, Impaired UE functional use, Decreased strength, Increased muscle spasms, Decreased knowledge of precautions, Increased edema  Visit Diagnosis: Acute pain of right shoulder  Stiffness of right shoulder, not elsewhere classified  Muscle weakness (generalized)  Localized edema     Problem List Patient Active Problem List   Diagnosis Date Noted  . 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 Hypertension 01/14/2019  . Acute metabolic encephalopathy 62/69/4854  . DKA (diabetic ketoacidoses) ([place]) 01/13/2019  . Diabetes mellitus without complication ([place])   . History of seizure   . Posterior dislocation of left shoulder joint   . Posterior dislocation of right shoulder joint     Hilda Blades, PT, DPT, LAT, ATC 05/18/19  9:09 AM Phone: [phone] Fax: [facility] [address] [place], [place], [address] Phone: [phone]   Fax:  [phone]  Name: Alan Alvarez MRN: [id number] Date of Birth: 14-Aug-1980

## 2019-05-23 ENCOUNTER — Ambulatory Visit: Payer: Self-pay | Attending: Orthopedic Surgery | Admitting: Physical Therapy

## 2019-05-23 ENCOUNTER — Encounter: Payer: Self-pay | Admitting: Physical Therapy

## 2019-05-23 ENCOUNTER — Other Ambulatory Visit: Payer: Self-pay

## 2019-05-23 DIAGNOSIS — M25611 Stiffness of right shoulder, not elsewhere classified: Secondary | ICD-10-CM | POA: Insufficient documentation

## 2019-05-23 DIAGNOSIS — M6281 Muscle weakness (generalized): Secondary | ICD-10-CM | POA: Insufficient documentation

## 2019-05-23 DIAGNOSIS — R600 Localized edema: Secondary | ICD-10-CM | POA: Insufficient documentation

## 2019-05-23 DIAGNOSIS — M25511 Pain in right shoulder: Secondary | ICD-10-CM | POA: Insufficient documentation

## 2019-05-23 NOTE — Therapy (Signed)
[facility] [address] [place], [place], [address] Phone: [phone]   Fax:  [phone]  Physical Therapy Treatment  Patient Details  Name: Alan Alvarez MRN: [id number] Date of Birth: [birth year] Referring Provider (PT): Yolonda Kida, MD   Encounter Date: 05/23/2019  PT End of Session - 05/23/19 0919    Visit Number  14    Number of Visits  24    Date for PT Re-Evaluation  06/15/19    Authorization Type  Self pay    PT Start Time  0902    PT Stop Time  0945    PT Time Calculation (min)  43 min    Activity Tolerance  Patient limited by pain    Behavior During Therapy  [facility] for tasks assessed/performed       Past Medical History:  Diagnosis Date  . Diabetes mellitus without complication (HCC)    type 2  . History of seizure   . Hyperlipidemia   . Hypertension     Past Surgical History:  Procedure Laterality Date  . SHOULDER HEMI-ARTHROPLASTY Right 03/03/2019   Procedure: SHOULDER HEMI-ARTHROPLASTY;  Surgeon: Yolonda Kida, MD;  Location: [facility] OR;  Service: Orthopedics;  Laterality: Right;    There were no vitals filed for this visit.  Subjective Assessment - 05/23/19 0916    Subjective  Patient reports he is having more pain because he thinks the cold weather is causing it to feel worse.    Patient Stated Goals  Be able to use right shoulder overhead and for activity to return to previous level of function.    Currently in Pain?  Yes    Pain Score  7     Pain Location  Shoulder    Pain Orientation  Right    Pain Descriptors / Indicators  Aching;Throbbing;Sharp;Tightness    Pain Type  Chronic pain;Surgical pain    Pain Onset  More than a month ago    Pain Frequency  Constant                       [facility] Adult PT Treatment/Exercise - 05/23/19 0001      Exercises   Exercises  Shoulder      Shoulder Exercises: Supine   Flexion Limitations  x10 chest press with dowel, 2x8 supine  straight arm flexion AROM, 2x6 straight arm flexion AROM at 30 deg incline, x6 straight arm flexion AROM at 45 deg incline      Shoulder Exercises: Standing   Flexion Limitations  Wall ball forward roll up 2x6    Extension  20 reps    Theraband Level (Shoulder Extension)  Level 2 (Red)    Row  20 reps    Theraband Level (Shoulder Row)  Level 2 (Red)    Other Standing Exercises  Wall walk x5             PT Education - 05/23/19 0918    Education Details  HEP    Person(s) Educated  Patient    Methods  Explanation;Demonstration;Tactile cues;Verbal cues    Comprehension  Verbalized understanding;Returned demonstration;Verbal cues required;Tactile cues required;Need further instruction       PT Short Term Goals - 05/04/19 0944      PT SHORT TERM GOAL #1   Title  Patient will be indepdnent with initial HEP to progress range of motion.    Time  3    Period  Weeks    Status  Revised  Target Date  05/25/19      PT SHORT TERM GOAL #2   Title  Patient will report < or  = 3-4/10 with exercise in order to improve activity tolerance and allow progression    Time  3    Period  Weeks    Status  Revised    Target Date  05/25/19      PT SHORT TERM GOAL #3   Title  Patient will achieve right shoulder PROM 120 deg elevation and 30 deg ER to reach milestones.    Time  3    Period  Weeks    Status  Revised    Target Date  05/25/19        PT Long Term Goals - 05/04/19 0945      PT LONG TERM GOAL #1   Title  Patient will report < or = 2-3/10 pain level with activity to allow for improved functional level.    Time  6    Period  Weeks    Status  Revised    Target Date  06/15/19      PT LONG TERM GOAL #2   Title  Patient will report improved functional level by FOTO < or = 55%.    Time  6    Period  Weeks    Status  Revised    Target Date  06/15/19      PT LONG TERM GOAL #3   Title  Patient will exhibit 90 deg AROM to improve dressing and grooming ability.    Time  6     Period  Weeks    Status  Revised    Target Date  06/15/19      PT LONG TERM GOAL #4   Title  Establish new long term goals at 6 week assessment.            Plan - 05/23/19 0919    Clinical Impression Statement  Patient continues to be limited by pain with exercise. He is able to perform the [facility] and AROM exercise but reports increased pain with them. He is progressing with incline with flexion but is unable to lift his arm through full range against gravity. He would benefit from continued skilled PT to progress motion and strength to improve right arm function.    PT Treatment/Interventions  ADLs/Self Care Home Management;Cryotherapy;Electrical Stimulation;Moist Heat;Therapeutic activities;Therapeutic exercise;Neuromuscular re-education;Patient/family education;Manual techniques;Dry needling;Passive range of motion;Scar mobilization;Taping;Joint Manipulations;Spinal Manipulations    PT Next Visit Plan  Assess HEP and ensure proper sling wear/posture; progress PROM shoulder flexion, IR, and ER <30 deg all in scap plane; deltoid isometrics, elbow/wrist/hand AROM    PT Home Exercise Plan  Standing forward table slide shoulder AAROM, standing ER doorframe stretch, supine shoulder elevation AROM (progress incline using pillows), wall walk    Consulted and Agree with Plan of Care  Patient       Patient will benefit from skilled therapeutic intervention in order to improve the following deficits and impairments:  Decreased range of motion, Pain, Postural dysfunction, Impaired UE functional use, Decreased strength, Increased muscle spasms, Decreased knowledge of precautions, Increased edema  Visit Diagnosis: Acute pain of right shoulder  Stiffness of right shoulder, not elsewhere classified  Muscle weakness (generalized)  Localized edema     Problem List Patient Active Problem List   Diagnosis Date Noted  . 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 Hypertension 01/14/2019  .  Acute metabolic encephalopathy 01/14/2019  . DKA (diabetic ketoacidoses) (HCC) 01/13/2019  .  Diabetes mellitus without complication ([place])   . History of seizure   . Posterior dislocation of left shoulder joint   . Posterior dislocation of right shoulder joint     Hilda Blades, PT, DPT, LAT, ATC 05/23/19  10:12 AM Phone: [phone] Fax: [place] [facility] [address] [place], [place], [address] Phone: [phone]   Fax:  [phone]  Name: Alan Alvarez MRN: [id number] Date of Birth: 1980/12/14

## 2019-05-25 ENCOUNTER — Ambulatory Visit: Payer: Self-pay | Admitting: Physical Therapy

## 2019-05-31 ENCOUNTER — Other Ambulatory Visit: Payer: Self-pay

## 2019-05-31 ENCOUNTER — Encounter: Payer: Self-pay | Admitting: Physical Therapy

## 2019-05-31 ENCOUNTER — Ambulatory Visit: Payer: Self-pay | Admitting: Physical Therapy

## 2019-05-31 DIAGNOSIS — M25511 Pain in right shoulder: Secondary | ICD-10-CM

## 2019-05-31 DIAGNOSIS — M6281 Muscle weakness (generalized): Secondary | ICD-10-CM

## 2019-05-31 DIAGNOSIS — M25611 Stiffness of right shoulder, not elsewhere classified: Secondary | ICD-10-CM

## 2019-05-31 DIAGNOSIS — R600 Localized edema: Secondary | ICD-10-CM

## 2019-05-31 NOTE — Therapy (Signed)
Alan Alvarez, [place], [address] Phone: [phone]   Fax:  [phone]  Physical Therapy Treatment  Patient Details  Name: Alan Alvarez MRN: [id number] Date of Birth: 1980/10/25 Referring Provider (PT): Nicholes Stairs, MD   Encounter Date: 05/31/2019  PT End of Session - 05/31/19 0832    Visit Number  15    Number of Visits  24    Date for PT Re-Evaluation  06/15/19    Authorization Type  Self pay    PT Start Time  0822    PT Stop Time  0902    PT Time Calculation (min)  40 min    Activity Tolerance  Patient tolerated treatment well    Behavior During Therapy  [facility] for tasks assessed/performed       Past Medical History:  Diagnosis Date  . Diabetes mellitus without complication ([place])    type 2  . History of seizure   . Hyperlipidemia   . Hypertension     Past Surgical History:  Procedure Laterality Date  . SHOULDER HEMI-ARTHROPLASTY Right 03/03/2019   Procedure: SHOULDER HEMI-ARTHROPLASTY;  Surgeon: Nicholes Stairs, MD;  Location: [place];  Service: Orthopedics;  Laterality: Right;    There were no vitals filed for this visit.  Subjective Assessment - 05/31/19 0826    Subjective  Patient reports his shoulder feeling a little better. He saw his doctor since last visit. He did feel like he had an incident where his shoulder moved forwar but has gotten better since then.    Currently in Pain?  Yes    Pain Score  4     Pain Location  Shoulder    Pain Orientation  Right    Pain Descriptors / Indicators  Aching;Throbbing;Tightness;Sharp    Pain Type  Chronic pain;Surgical pain    Pain Onset  More than a month ago    Pain Frequency  Constant                       [facility] Adult PT Treatment/Exercise - 05/31/19 0001      Exercises   Exercises  Shoulder      Shoulder Exercises: Supine   Flexion Limitations  x10 chest press with dowel, 2x10 supine straight arm flexion AROM,  x10 straight arm flexion with 1 lb, 2x6 straight arm flexion AROM at 45 deg incline      Shoulder Exercises: Prone   Extension Limitations  2x10      Shoulder Exercises: Sidelying   ABduction Limitations  3x5      Shoulder Exercises: Standing   Flexion Limitations  Wall ball forward roll up 2x6    Extension  20 reps   2 sets   Theraband Level (Shoulder Extension)  Level 2 (Red)    Row  20 reps   2 sets   Theraband Level (Shoulder Row)  Level 2 (Red)             PT Education - 05/31/19 0831    Education Details  HEP    Person(s) Educated  Patient    Methods  Explanation;Demonstration;Verbal cues;Tactile cues    Comprehension  Verbalized understanding;Returned demonstration;Verbal cues required;Need further instruction       PT Short Term Goals - 05/31/19 0900      PT SHORT TERM GOAL #1   Title  Patient will be indepdnent with initial HEP to progress range of motion.    Time  3  Period  Weeks    Status  On-going    Target Date  05/25/19      PT SHORT TERM GOAL #2   Title  Patient will report < or  = 3-4/10 with exercise in order to improve activity tolerance and allow progression    Time  3    Period  Weeks    Status  Partially Met    Target Date  05/25/19      PT SHORT TERM GOAL #3   Title  Patient will achieve right shoulder PROM 120 deg elevation and 30 deg ER to reach milestones.    Time  3    Period  Weeks    Status  Not Met    Target Date  05/25/19        PT Long Term Goals - 05/04/19 0945      PT LONG TERM GOAL #1   Title  Patient will report < or = 2-3/10 pain level with activity to allow for improved functional level.    Time  6    Period  Weeks    Status  Revised    Target Date  06/15/19      PT LONG TERM GOAL #2   Title  Patient will report improved functional level by FOTO < or = 55%.    Time  6    Period  Weeks    Status  Revised    Target Date  06/15/19      PT LONG TERM GOAL #3   Title  Patient will exhibit 90 deg AROM to  improve dressing and grooming ability.    Time  6    Period  Weeks    Status  Revised    Target Date  06/15/19      PT LONG TERM GOAL #4   Title  Establish new long term goals at 6 week assessment.            Plan - 05/31/19 8657    Clinical Impression Statement  Patient exhibited improvement in pain level and active motion within available range this visit. He does continue to exhibit limitation in his overall motion, especially actively against gravity, and strength deficit of shoulder. He would benefit from continued skilled PT to progress motion and strength to improve right arm function.    PT Treatment/Interventions  ADLs/Self Care Home Management;Cryotherapy;Electrical Stimulation;Moist Heat;Therapeutic activities;Therapeutic exercise;Neuromuscular re-education;Patient/family education;Manual techniques;Dry needling;Passive range of motion;Scar mobilization;Taping;Joint Manipulations;Spinal Manipulations    PT Next Visit Plan  Assess HEP and ensure proper sling wear/posture; progress PROM shoulder flexion, IR, and ER <30 deg all in scap plane; deltoid isometrics, elbow/wrist/hand AROM    PT Home Exercise Plan  Standing forward table slide shoulder AAROM, standing ER doorframe stretch, supine shoulder elevation AROM (progress incline using pillows), wall walk    Consulted and Agree with Plan of Care  Patient       Patient will benefit from skilled therapeutic intervention in order to improve the following deficits and impairments:  Decreased range of motion, Pain, Postural dysfunction, Impaired UE functional use, Decreased strength, Increased muscle spasms, Decreased knowledge of precautions, Increased edema  Visit Diagnosis: Acute pain of right shoulder  Stiffness of right shoulder, not elsewhere classified  Muscle weakness (generalized)  Localized edema     Problem List Patient Active Problem List   Diagnosis Date Noted  . 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 Hypertension 01/14/2019  . Acute metabolic encephalopathy 84/69/6295  . DKA (diabetic  ketoacidoses) ([place]) 01/13/2019  . Diabetes mellitus without complication ([place])   . History of seizure   . Posterior dislocation of left shoulder joint   . Posterior dislocation of right shoulder joint     Hilda Blades, PT, DPT, LAT, ATC 05/31/19  9:05 AM Phone: [phone] Fax: [place] [facility] [address] [place], [place], [address] Phone: [phone]   Fax:  [phone]  Name: Alan Alvarez MRN: [id number] Date of Birth: April 09, 1981

## 2019-06-02 ENCOUNTER — Ambulatory Visit: Payer: Self-pay | Admitting: Physical Therapy

## 2019-06-02 ENCOUNTER — Encounter: Payer: Self-pay | Admitting: Physical Therapy

## 2019-06-02 ENCOUNTER — Other Ambulatory Visit: Payer: Self-pay

## 2019-06-02 DIAGNOSIS — M25511 Pain in right shoulder: Secondary | ICD-10-CM

## 2019-06-02 DIAGNOSIS — M25611 Stiffness of right shoulder, not elsewhere classified: Secondary | ICD-10-CM

## 2019-06-02 DIAGNOSIS — R600 Localized edema: Secondary | ICD-10-CM

## 2019-06-02 DIAGNOSIS — M6281 Muscle weakness (generalized): Secondary | ICD-10-CM

## 2019-06-02 NOTE — Therapy (Signed)
[place], [place], [address] Phone: [phone]   Fax:  [phone]  Physical Therapy Treatment  Patient Details  Name: Alan Alvarez MRN: [id number] Date of Birth: 1980-06-12 Referring Provider (PT): Nicholes Stairs, MD   Encounter Date: 06/02/2019  PT End of Session - 06/02/19 0842    Visit Number  16    Number of Visits  24    Date for PT Re-Evaluation  06/15/19    Authorization Type  Self pay    PT Start Time  0827    PT Stop Time  0908    PT Time Calculation (min)  41 min    Activity Tolerance  Patient tolerated treatment well    Behavior During Therapy  [facility] for tasks assessed/performed       Past Medical History:  Diagnosis Date  . Diabetes mellitus without complication ([place])    type 2  . History of seizure   . Hyperlipidemia   . Hypertension     Past Surgical History:  Procedure Laterality Date  . SHOULDER HEMI-ARTHROPLASTY Right 03/03/2019   Procedure: SHOULDER HEMI-ARTHROPLASTY;  Surgeon: Nicholes Stairs, MD;  Location: [place];  Service: Orthopedics;  Laterality: Right;    There were no vitals filed for this visit.  Subjective Assessment - 06/02/19 0838    Subjective  Patient reports he is feeling better.    Patient Stated Goals  Be able to use right shoulder overhead and for activity to return to previous level of function.    Currently in Pain?  Yes    Pain Score  4     Pain Location  Shoulder    Pain Orientation  Right    Pain Descriptors / Indicators  Aching;Throbbing;Tightness;Sharp    Pain Type  Chronic pain;Surgical pain    Pain Onset  More than a month ago    Pain Frequency  Constant                       [facility] Adult PT Treatment/Exercise - 06/02/19 0001      Exercises   Exercises  Shoulder      Shoulder Exercises: Supine   Flexion Limitations  2x10 supine straight arm flexion AROM, 3x6 straight arm flexion with 2 lb, 3x8 straight arm  flexion AROM at 45 deg incline    Other Supine Exercises  2x10 chest press with 2 lbs on dowel      Shoulder Exercises: Prone   Extension Limitations  2x10      Shoulder Exercises: Sidelying   ABduction Limitations  3x8      Shoulder Exercises: Standing   Flexion  10 reps;AROM    Flexion Limitations  through available range    Extension  20 reps   2 sets   Theraband Level (Shoulder Extension)  Level 2 (Red)    Other Standing Exercises  Wall ball forward roll up 2x5      Shoulder Exercises: Pulleys   Flexion  3 minutes      Shoulder Exercises: ROM/Strengthening   UBE (Upper Arm Bike)  5 min (fwd/bwd)             PT Education - 06/02/19 0840    Education Details  HEP    Person(s) Educated  Patient    Methods  Explanation;Demonstration;Verbal cues    Comprehension  Verbalized understanding;Returned demonstration;Tactile cues required       PT Short Term Goals - 05/31/19 0900  PT SHORT TERM GOAL #1   Title  Patient will be indepdnent with initial HEP to progress range of motion.    Time  3    Period  Weeks    Status  On-going    Target Date  05/25/19      PT SHORT TERM GOAL #2   Title  Patient will report < or  = 3-4/10 with exercise in order to improve activity tolerance and allow progression    Time  3    Period  Weeks    Status  Partially Met    Target Date  05/25/19      PT SHORT TERM GOAL #3   Title  Patient will achieve right shoulder PROM 120 deg elevation and 30 deg ER to reach milestones.    Time  3    Period  Weeks    Status  Not Met    Target Date  05/25/19        PT Long Term Goals - 05/04/19 0945      PT LONG TERM GOAL #1   Title  Patient will report < or = 2-3/10 pain level with activity to allow for improved functional level.    Time  6    Period  Weeks    Status  Revised    Target Date  06/15/19      PT LONG TERM GOAL #2   Title  Patient will report improved functional level by FOTO < or = 55%.    Time  6    Period  Weeks     Status  Revised    Target Date  06/15/19      PT LONG TERM GOAL #3   Title  Patient will exhibit 90 deg AROM to improve dressing and grooming ability.    Time  6    Period  Weeks    Status  Revised    Target Date  06/15/19      PT LONG TERM GOAL #4   Title  Establish new long term goals at 6 week assessment.            Plan - 06/02/19 0907    Clinical Impression Statement  Patient tolerated progression in strengthening exercises well with visit. He continues to exhibit limitation in motion and strength bilaterally but greater on right. He would benefit from continued skilled PT to progress motion and strength to improve right arm function.    PT Treatment/Interventions  ADLs/Self Care Home Management;Cryotherapy;Electrical Stimulation;Moist Heat;Therapeutic activities;Therapeutic exercise;Neuromuscular re-education;Patient/family education;Manual techniques;Dry needling;Passive range of motion;Scar mobilization;Taping;Joint Manipulations;Spinal Manipulations    PT Next Visit Plan  Assess HEP and ensure proper sling wear/posture; progress PROM shoulder flexion, IR, and ER <30 deg all in scap plane; deltoid isometrics, elbow/wrist/hand AROM    PT Home Exercise Plan  Standing forward table slide shoulder AAROM, standing ER doorframe stretch, supine shoulder elevation AROM (progress incline using pillows), wall walk       Patient will benefit from skilled therapeutic intervention in order to improve the following deficits and impairments:  Decreased range of motion, Pain, Postural dysfunction, Impaired UE functional use, Decreased strength, Increased muscle spasms, Decreased knowledge of precautions, Increased edema  Visit Diagnosis: Acute pain of right shoulder  Stiffness of right shoulder, not elsewhere classified  Muscle weakness (generalized)  Localized edema     Problem List Patient Active Problem List   Diagnosis Date Noted  . S/P shoulder replacement, right  03/03/2019  . Hypertension 01/14/2019  .  Acute metabolic encephalopathy 29/92/4268  . DKA (diabetic ketoacidoses) ([place]) 01/13/2019  . Diabetes mellitus without complication ([place])   . History of seizure   . Posterior dislocation of left shoulder joint   . Posterior dislocation of right shoulder joint     Hilda Blades, PT, DPT, LAT, ATC 06/02/19  9:13 AM Phone: [phone] Fax: [place] [facility] [address] [place], [place], [address] Phone: [phone]   Fax:  [phone]  Name: Akshaj Besancon MRN: [id number] Date of Birth: [birth year]

## 2019-06-06 ENCOUNTER — Ambulatory Visit: Payer: Self-pay | Admitting: Physical Therapy

## 2019-06-06 DIAGNOSIS — R600 Localized edema: Secondary | ICD-10-CM

## 2019-06-06 DIAGNOSIS — M25611 Stiffness of right shoulder, not elsewhere classified: Secondary | ICD-10-CM

## 2019-06-06 DIAGNOSIS — M6281 Muscle weakness (generalized): Secondary | ICD-10-CM

## 2019-06-06 DIAGNOSIS — M25511 Pain in right shoulder: Secondary | ICD-10-CM

## 2019-06-07 ENCOUNTER — Encounter: Payer: Self-pay | Admitting: Physical Therapy

## 2019-06-07 ENCOUNTER — Other Ambulatory Visit: Payer: Self-pay

## 2019-06-07 NOTE — Therapy (Signed)
[place], [place], [address] Phone: [phone]   Fax:  [phone]  Physical Therapy Treatment  Patient Details  Name: Alan Alvarez MRN: [id number] Date of Birth: 08-Jan-1981 Referring Provider (PT): Nicholes Stairs, MD   Encounter Date: 06/06/2019  PT End of Session - 06/07/19 1343    Visit Number  17    Number of Visits  24    Date for PT Re-Evaluation  06/15/19    Authorization Type  Self pay    PT Start Time  0825    PT Stop Time  0905    PT Time Calculation (min)  40 min    Activity Tolerance  Patient tolerated treatment well    Behavior During Therapy  [facility] for tasks assessed/performed       Past Medical History:  Diagnosis Date  . Diabetes mellitus without complication ([place])    type 2  . History of seizure   . Hyperlipidemia   . Hypertension     Past Surgical History:  Procedure Laterality Date  . SHOULDER HEMI-ARTHROPLASTY Right 03/03/2019   Procedure: SHOULDER HEMI-ARTHROPLASTY;  Surgeon: Nicholes Stairs, MD;  Location: [place];  Service: Orthopedics;  Laterality: Right;    There were no vitals filed for this visit.  Subjective Assessment - 06/07/19 1337    Subjective  Patient reports he is doing well. He still has difficulty and pain lifting his arm.    Patient Stated Goals  Be able to use right shoulder overhead and for activity to return to previous level of function.    Currently in Pain?  Yes    Pain Score  4     Pain Location  Shoulder    Pain Orientation  Right    Pain Descriptors / Indicators  Aching;Tightness;Sore    Pain Type  Chronic pain;Surgical pain    Pain Onset  More than a month ago    Pain Frequency  Constant         [facility] PT Assessment - 06/07/19 0001      Assessment   Medical Diagnosis  s/p right shoulder hemiarthroplasty    Referring Provider (PT)  Nicholes Stairs, MD    Onset Date/Surgical Date  03/03/19                    [facility] Adult PT Treatment/Exercise - 06/07/19 0001      Exercises   Exercises  Shoulder      Shoulder Exercises: Supine   Flexion Limitations  2x10 supine straight arm flexion AROM, 2x8 straight arm flexion with 2 lb, 3x8 straight arm flexion AROM at 45 deg incline      Shoulder Exercises: Standing   Flexion  10 reps;AROM    Flexion Limitations  through available range    Extension  20 reps   2 sets   Theraband Level (Shoulder Extension)  Level 3 (Green)    Row  20 reps   2 sets   Theraband Level (Shoulder Row)  Level 3 (Green)    Other Standing Exercises  Wall ball forward roll up 2x5      Shoulder Exercises: ROM/Strengthening   UBE (Upper Arm Bike)  L2 x 5 min (fwd/bwd)      Shoulder Exercises: Isometric Strengthening   Flexion  5X5"    Extension  5X5"    External Rotation  5X5"    ABduction  5X5"      Shoulder Exercises: Stretch   External  Rotation Stretch  5 reps;10 seconds   doorway            PT Education - 06/07/19 1342    Education Details  HEP    Person(s) Educated  Patient    Methods  Explanation;Demonstration;Verbal cues;Tactile cues    Comprehension  Verbalized understanding;Returned demonstration;Verbal cues required;Tactile cues required;Need further instruction       PT Short Term Goals - 05/31/19 0900      PT SHORT TERM GOAL #1   Title  Patient will be indepdnent with initial HEP to progress range of motion.    Time  3    Period  Weeks    Status  On-going    Target Date  05/25/19      PT SHORT TERM GOAL #2   Title  Patient will report < or  = 3-4/10 with exercise in order to improve activity tolerance and allow progression    Time  3    Period  Weeks    Status  Partially Met    Target Date  05/25/19      PT SHORT TERM GOAL #3   Title  Patient will achieve right shoulder PROM 120 deg elevation and 30 deg ER to reach milestones.    Time  3    Period  Weeks    Status  Not Met    Target Date  05/25/19         PT Long Term Goals - 05/04/19 0945      PT LONG TERM GOAL #1   Title  Patient will report < or = 2-3/10 pain level with activity to allow for improved functional level.    Time  6    Period  Weeks    Status  Revised    Target Date  06/15/19      PT LONG TERM GOAL #2   Title  Patient will report improved functional level by FOTO < or = 55%.    Time  6    Period  Weeks    Status  Revised    Target Date  06/15/19      PT LONG TERM GOAL #3   Title  Patient will exhibit 90 deg AROM to improve dressing and grooming ability.    Time  6    Period  Weeks    Status  Revised    Target Date  06/15/19      PT LONG TERM GOAL #4   Title  Establish new long term goals at 6 week assessment.            Plan - 06/07/19 1344    Clinical Impression Statement  Patient tolerated therapy well, he continues to exhibit limited strength and range of motion of the right shoulder, with pain during strengthening exercises. His left shoulder also continues to exhibit limited motion and strength. He would benefit from continued skilled PT to progress motion and strength as able.    PT Treatment/Interventions  ADLs/Self Care Home Management;Cryotherapy;Electrical Stimulation;Moist Heat;Therapeutic activities;Therapeutic exercise;Neuromuscular re-education;Patient/family education;Manual techniques;Dry needling;Passive range of motion;Scar mobilization;Taping;Joint Manipulations;Spinal Manipulations    PT Next Visit Plan  Assess HEP and ensure proper sling wear/posture; progress PROM shoulder flexion, IR, and ER <30 deg all in scap plane; deltoid isometrics, elbow/wrist/hand AROM    PT Home Exercise Plan  Standing forward table slide shoulder AAROM, standing ER doorframe stretch, supine shoulder elevation AROM (progress incline using pillows), wall walk    Consulted and Agree with Plan of Care  Patient       Patient will benefit from skilled therapeutic intervention in order to improve the following  deficits and impairments:  Decreased range of motion, Pain, Postural dysfunction, Impaired UE functional use, Decreased strength, Increased muscle spasms, Decreased knowledge of precautions, Increased edema  Visit Diagnosis: Acute pain of right shoulder  Muscle weakness (generalized)  Stiffness of right shoulder, not elsewhere classified  Localized edema     Problem List Patient Active Problem List   Diagnosis Date Noted  . 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 Hypertension 01/14/2019  . Acute metabolic encephalopathy 69/62/9528  . DKA (diabetic ketoacidoses) ([place]) 01/13/2019  . Diabetes mellitus without complication ([place])   . History of seizure   . Posterior dislocation of left shoulder joint   . Posterior dislocation of right shoulder joint     Hilda Blades, PT, DPT, LAT, ATC 06/07/19  1:54 PM Phone: [phone] Fax: [facility] [address] [place], [place], [address] Phone: [phone]   Fax:  [phone]  Name: Alan Alvarez MRN: [id number] Date of Birth: 06/08/1980

## 2019-06-08 ENCOUNTER — Ambulatory Visit: Payer: Self-pay | Admitting: Physical Therapy

## 2019-06-13 ENCOUNTER — Encounter: Payer: Self-pay | Admitting: Physical Therapy

## 2019-06-13 ENCOUNTER — Other Ambulatory Visit: Payer: Self-pay

## 2019-06-13 ENCOUNTER — Ambulatory Visit: Payer: Self-pay | Admitting: Physical Therapy

## 2019-06-13 DIAGNOSIS — R600 Localized edema: Secondary | ICD-10-CM

## 2019-06-13 DIAGNOSIS — M6281 Muscle weakness (generalized): Secondary | ICD-10-CM

## 2019-06-13 DIAGNOSIS — M25511 Pain in right shoulder: Secondary | ICD-10-CM

## 2019-06-13 DIAGNOSIS — M25611 Stiffness of right shoulder, not elsewhere classified: Secondary | ICD-10-CM

## 2019-06-13 NOTE — Therapy (Signed)
[place], [place], [address] Phone: [phone]   Fax:  [phone]  Physical Therapy Treatment  Patient Details  Name: Alan Alvarez MRN: [id number] Date of Birth: 04-16-1981 Referring Provider (PT): Nicholes Stairs, MD   Encounter Date: 06/13/2019  PT End of Session - 06/13/19 0833    Visit Number  18    Number of Visits  24    Date for PT Re-Evaluation  06/15/19    Authorization Type  Self pay    PT Start Time  0825    PT Stop Time  0905    PT Time Calculation (min)  40 min    Activity Tolerance  Patient tolerated treatment well    Behavior During Therapy  [facility] for tasks assessed/performed       Past Medical History:  Diagnosis Date  . Diabetes mellitus without complication ([place])    type 2  . History of seizure   . Hyperlipidemia   . Hypertension     Past Surgical History:  Procedure Laterality Date  . SHOULDER HEMI-ARTHROPLASTY Right 03/03/2019   Procedure: SHOULDER HEMI-ARTHROPLASTY;  Surgeon: Nicholes Stairs, MD;  Location: [place];  Service: Orthopedics;  Laterality: Right;    There were no vitals filed for this visit.  Subjective Assessment - 06/13/19 0831    Subjective  Patient reports he is doing well. He continues to have pain with shoulder movement, limited motion, and exercises are difficult.    Patient Stated Goals  Be able to use right shoulder overhead and for activity to return to previous level of function.    Currently in Pain?  Yes    Pain Score  5     Pain Location  Shoulder    Pain Orientation  Right    Pain Descriptors / Indicators  Aching;Tightness;Sore    Pain Type  Chronic pain;Surgical pain    Pain Onset  More than a month ago    Pain Frequency  Constant         [facility] PT Assessment - 06/13/19 0001      Assessment   Medical Diagnosis  s/p right shoulder hemiarthroplasty    Referring Provider (PT)  Nicholes Stairs, MD    Onset Date/Surgical  Date  03/03/19                   [facility] Adult PT Treatment/Exercise - 06/13/19 0001      Exercises   Exercises  Shoulder      Shoulder Exercises: Supine   Flexion Limitations  2x10 supine straight arm flexion AROM, 2x8 straight arm flexion with 2 lb, 2x8 straight arm flexion AROM at 45 deg incline      Shoulder Exercises: Standing   Flexion  10 reps;AROM    Flexion Limitations  through available range    Extension  20 reps   2 sets   Theraband Level (Shoulder Extension)  Level 3 (Green)    Row  20 reps   2 sets   Theraband Level (Shoulder Row)  Level 3 (Green)    Other Standing Exercises  Wall ball forward roll up 2x5      Shoulder Exercises: ROM/Strengthening   UBE (Upper Arm Bike)  L2 x 5 min (fwd/bwd)      Shoulder Exercises: Isometric Strengthening   Flexion  5X5"    Extension  5X5"    External Rotation  5X5"    ABduction  5X5"      Shoulder  Exercises: Stretch   External Rotation Stretch  5 reps;10 seconds   doorway            PT Education - 06/13/19 [phone]    Education Details  HEP    Person(s) Educated  Patient    Methods  Explanation;Demonstration;Verbal cues;Tactile cues    Comprehension  Verbalized understanding;Returned demonstration;Verbal cues required;Tactile cues required;Need further instruction       PT Short Term Goals - 05/31/19 0900      PT SHORT TERM GOAL #1   Title  Patient will be indepdnent with initial HEP to progress range of motion.    Time  3    Period  Weeks    Status  On-going    Target Date  05/25/19      PT SHORT TERM GOAL #2   Title  Patient will report < or  = 3-4/10 with exercise in order to improve activity tolerance and allow progression    Time  3    Period  Weeks    Status  Partially Met    Target Date  05/25/19      PT SHORT TERM GOAL #3   Title  Patient will achieve right shoulder PROM 120 deg elevation and 30 deg ER to reach milestones.    Time  3    Period  Weeks    Status  Not Met    Target  Date  05/25/19        PT Long Term Goals - 05/04/19 0945      PT LONG TERM GOAL #1   Title  Patient will report < or = 2-3/10 pain level with activity to allow for improved functional level.    Time  6    Period  Weeks    Status  Revised    Target Date  06/15/19      PT LONG TERM GOAL #2   Title  Patient will report improved functional level by FOTO < or = 55%.    Time  6    Period  Weeks    Status  Revised    Target Date  06/15/19      PT LONG TERM GOAL #3   Title  Patient will exhibit 90 deg AROM to improve dressing and grooming ability.    Time  6    Period  Weeks    Status  Revised    Target Date  06/15/19      PT LONG TERM GOAL #4   Title  Establish new long term goals at 6 week assessment.            Plan - 06/13/19 0834    Clinical Impression Statement  Patient tolerated therapy well with no adverse effects. He continues to be limited with his range of motion and pain with all strengthening exercises. It is unclear what is causing the motion and strength deficit and he has had significant amounts of pain throughout therapy. He would benefit from continued skilled PT to progress his motion and strength and reduce pain level with activity.    PT Treatment/Interventions  ADLs/Self Care Home Management;Cryotherapy;Electrical Stimulation;Moist Heat;Therapeutic activities;Therapeutic exercise;Neuromuscular re-education;Patient/family education;Manual techniques;Dry needling;Passive range of motion;Scar mobilization;Taping;Joint Manipulations;Spinal Manipulations    PT Next Visit Plan  Assess HEP and ensure proper sling wear/posture; progress PROM shoulder flexion, IR, and ER <30 deg all in scap plane; deltoid isometrics, elbow/wrist/hand AROM    PT Home Exercise Plan  Standing forward table slide shoulder AAROM, standing ER doorframe  stretch, supine shoulder elevation AROM (progress incline using pillows), wall walk    Consulted and Agree with Plan of Care  Patient        Patient will benefit from skilled therapeutic intervention in order to improve the following deficits and impairments:  Decreased range of motion, Pain, Postural dysfunction, Impaired UE functional use, Decreased strength, Increased muscle spasms, Decreased knowledge of precautions, Increased edema  Visit Diagnosis: Acute pain of right shoulder  Muscle weakness (generalized)  Stiffness of right shoulder, not elsewhere classified  Localized edema     Problem List Patient Active Problem List   Diagnosis Date Noted  . 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 Hypertension 01/14/2019  . Acute metabolic encephalopathy 53/69/2230  . DKA (diabetic ketoacidoses) ([place]) 01/13/2019  . Diabetes mellitus without complication ([place])   . History of seizure   . Posterior dislocation of left shoulder joint   . Posterior dislocation of right shoulder joint     Hilda Blades, PT, DPT, LAT, ATC 06/13/19  9:08 AM Phone: [phone] Fax: [facility] [address] [place], [place], [address] Phone: [phone]   Fax:  [phone]  Name: Alan Alvarez MRN: [id number] Date of Birth: [birth year]

## 2019-06-15 ENCOUNTER — Ambulatory Visit: Payer: Self-pay | Admitting: Physical Therapy

## 2019-06-24 ENCOUNTER — Ambulatory Visit: Payer: Self-pay | Attending: Orthopedic Surgery | Admitting: Physical Therapy

## 2019-06-24 ENCOUNTER — Encounter: Payer: Self-pay | Admitting: Physical Therapy

## 2019-06-24 ENCOUNTER — Other Ambulatory Visit: Payer: Self-pay

## 2019-06-24 DIAGNOSIS — M25611 Stiffness of right shoulder, not elsewhere classified: Secondary | ICD-10-CM | POA: Insufficient documentation

## 2019-06-24 DIAGNOSIS — R600 Localized edema: Secondary | ICD-10-CM | POA: Insufficient documentation

## 2019-06-24 DIAGNOSIS — M25511 Pain in right shoulder: Secondary | ICD-10-CM | POA: Insufficient documentation

## 2019-06-24 DIAGNOSIS — M6281 Muscle weakness (generalized): Secondary | ICD-10-CM | POA: Insufficient documentation

## 2019-06-24 NOTE — Patient Instructions (Signed)
Access Code: [email or web address]  URL: [email or web address]  Date: 06/24/2019  Prepared by: Rosana Hoes   Exercises Standing Single Arm Shoulder Flexion Towel Slide at Table Top - 10 reps - 5 seconds hold - 2-3x daily Shoulder Abduction Towel Slide at Table Top - 10 reps - 5 seconds hold - 2-3x daily Standing Shoulder External Rotation Stretch in Doorway - 10 reps - 10 seconds hold - 2-3x daily Supine Shoulder Flexion Extension Full Range AROM - 10 reps - 3 sets - 1x daily Sidelying Shoulder Abduction - 10 reps - 3 sets - 1x daily Standing Row with Anchored Resistance - 20 reps - 2 sets - 1x daily Shoulder Extension with Band - 20 reps - 2 sets - 1x daily Standing Isometric Shoulder Flexion with Doorway - Arm Bent - 5 reps - 2 sets - 5 hold - 1x daily Standing Isometric Shoulder Abduction with Doorway - Arm Bent - 5 reps - 2 sets - 5 hold - 1x daily Standing Isometric Shoulder Extension with Doorway - Arm Bent - 5 reps - 2 sets - 5 hold - 1x daily Standing Isometric Shoulder External Rotation with Doorway and Towel Roll - 5 reps - 2 sets - 5 hold - 1x daily Standing Isometric Shoulder Internal Rotation with Towel Roll at Doorway - 5 reps - 2 sets - 5 hold - 1x daily

## 2019-06-24 NOTE — Therapy (Addendum)
[place], [place], [address] Phone: [phone]   Fax:  [phone]  Physical Therapy Treatment / Discharge   Progress Note Reporting Period 05/04/2019 to 06/24/2019  See note below for Objective Data and Assessment of Progress/Goals.    Patient Details  Name: Alan Alvarez MRN: [id number] Date of Birth: Nov 11, 1980 Referring Provider (PT): Nicholes Stairs, MD   Encounter Date: 06/24/2019    Past Medical History:  Diagnosis Date  . Diabetes mellitus without complication ([place])    type 2  . History of seizure   . Hyperlipidemia   . Hypertension     Past Surgical History:  Procedure Laterality Date  . SHOULDER HEMI-ARTHROPLASTY Right 03/03/2019   Procedure: SHOULDER HEMI-ARTHROPLASTY;  Surgeon: Nicholes Stairs, MD;  Location: [facility];  Service: Orthopedics;  Laterality: Right;    There were no vitals filed for this visit.                              PT Short Term Goals - 06/24/19 1059      PT SHORT TERM GOAL #1   Title  Patient will be indepdnent with initial HEP to progress range of motion.    Time  3    Period  Weeks    Status  Achieved      PT SHORT TERM GOAL #2   Title  Patient will report < or  = 3-4/10 with exercise in order to improve activity tolerance and allow progression    Period  Weeks    Status  Achieved      PT SHORT TERM GOAL #3   Title  Patient will achieve right shoulder PROM 120 deg elevation and 30 deg ER to reach milestones.    Time  4    Period  Weeks    Status  Partially Met    Target Date  07/22/19        PT Long Term Goals - 06/24/19 1100      PT LONG TERM GOAL #1   Title  Patient will report < or = 2-3/10 pain level with activity to allow for improved functional level.    Time  8    Period  Weeks    Status  Partially Met    Target Date  08/19/19      PT LONG TERM GOAL #2   Title  Patient will report improved  functional level by FOTO < or = 55% limitation.    Baseline  60% limitation    Time  6    Period  Weeks    Status  Partially Met    Target Date  08/19/19      PT LONG TERM GOAL #3   Title  Patient will exhibit 100 deg elevation AROM to improve dressing and grooming ability.    Time  8    Period  Weeks    Status  Revised    Target Date  08/19/19      PT LONG TERM GOAL #4   Title  Patient will be able to reach a shelf at shoulder height    Time  8    Period  Weeks    Status  New    Target Date  08/19/19      PT LONG TERM GOAL #5   Title  Patient will be able to reach behind back to L5 to improve dressing ability  Time  8    Period  Weeks    Status  New    Target Date  08/19/19              Patient will benefit from skilled therapeutic intervention in order to improve the following deficits and impairments:  Decreased range of motion, Pain, Postural dysfunction, Impaired UE functional use, Decreased strength, Increased muscle spasms, Decreased knowledge of precautions, Increased edema  Visit Diagnosis: Acute pain of right shoulder - Plan: PT plan of care cert/re-cert  Muscle weakness (generalized) - Plan: PT plan of care cert/re-cert  Stiffness of right shoulder, not elsewhere classified - Plan: PT plan of care cert/re-cert  Localized edema - Plan: PT plan of care cert/re-cert     Problem List Patient Active Problem List   Diagnosis Date Noted  . 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 Hypertension 01/14/2019  . Acute metabolic encephalopathy 94/17/4081  . DKA (diabetic ketoacidoses) ([place]) 01/13/2019  . Diabetes mellitus without complication ([place])   . History of seizure   . Posterior dislocation of left shoulder joint   . Posterior dislocation of right shoulder joint    PHYSICAL THERAPY DISCHARGE SUMMARY  Visits from Start of Care: 19  Current functional level related to goals / functional outcomes: See above   Remaining deficits: See  above   Education / Equipment: HEP  Plan: Patient agrees to discharge.  Patient goals were not met. Patient is being discharged due to not returning since the last visit.  ?????     Hilda Blades, PT, DPT, LAT, ATC 08/24/19  8:46 AM Phone: [phone] Fax: [facility] [address] [place], [place], [address] Phone: [phone]   Fax:  [phone]  Name: Hodges Treiber MRN: [id number] Date of Birth: 02-16-1981

## 2019-06-27 ENCOUNTER — Ambulatory Visit: Payer: Self-pay | Admitting: Physical Therapy

## 2019-07-04 ENCOUNTER — Ambulatory Visit: Payer: Self-pay | Admitting: Physical Therapy

## 2019-07-13 ENCOUNTER — Ambulatory Visit: Payer: Self-pay | Admitting: Physical Therapy

## 2019-07-20 ENCOUNTER — Ambulatory Visit: Payer: Self-pay | Admitting: Neurology

## 2020-05-21 ENCOUNTER — Other Ambulatory Visit: Payer: Self-pay | Admitting: Neurology

## 2020-05-21 IMAGING — DX DG SHOULDER 2+V*R*
2 series · 2 of 2 positions shown · non-contrast
Comparison: Right shoulder radiographs 01/13/2019

CLINICAL DATA: Recent bilateral shoulder dislocations. Persistent
pain, right greater than left.

EXAM:
RIGHT SHOULDER - 2+ VIEW

[shoulder grashey]
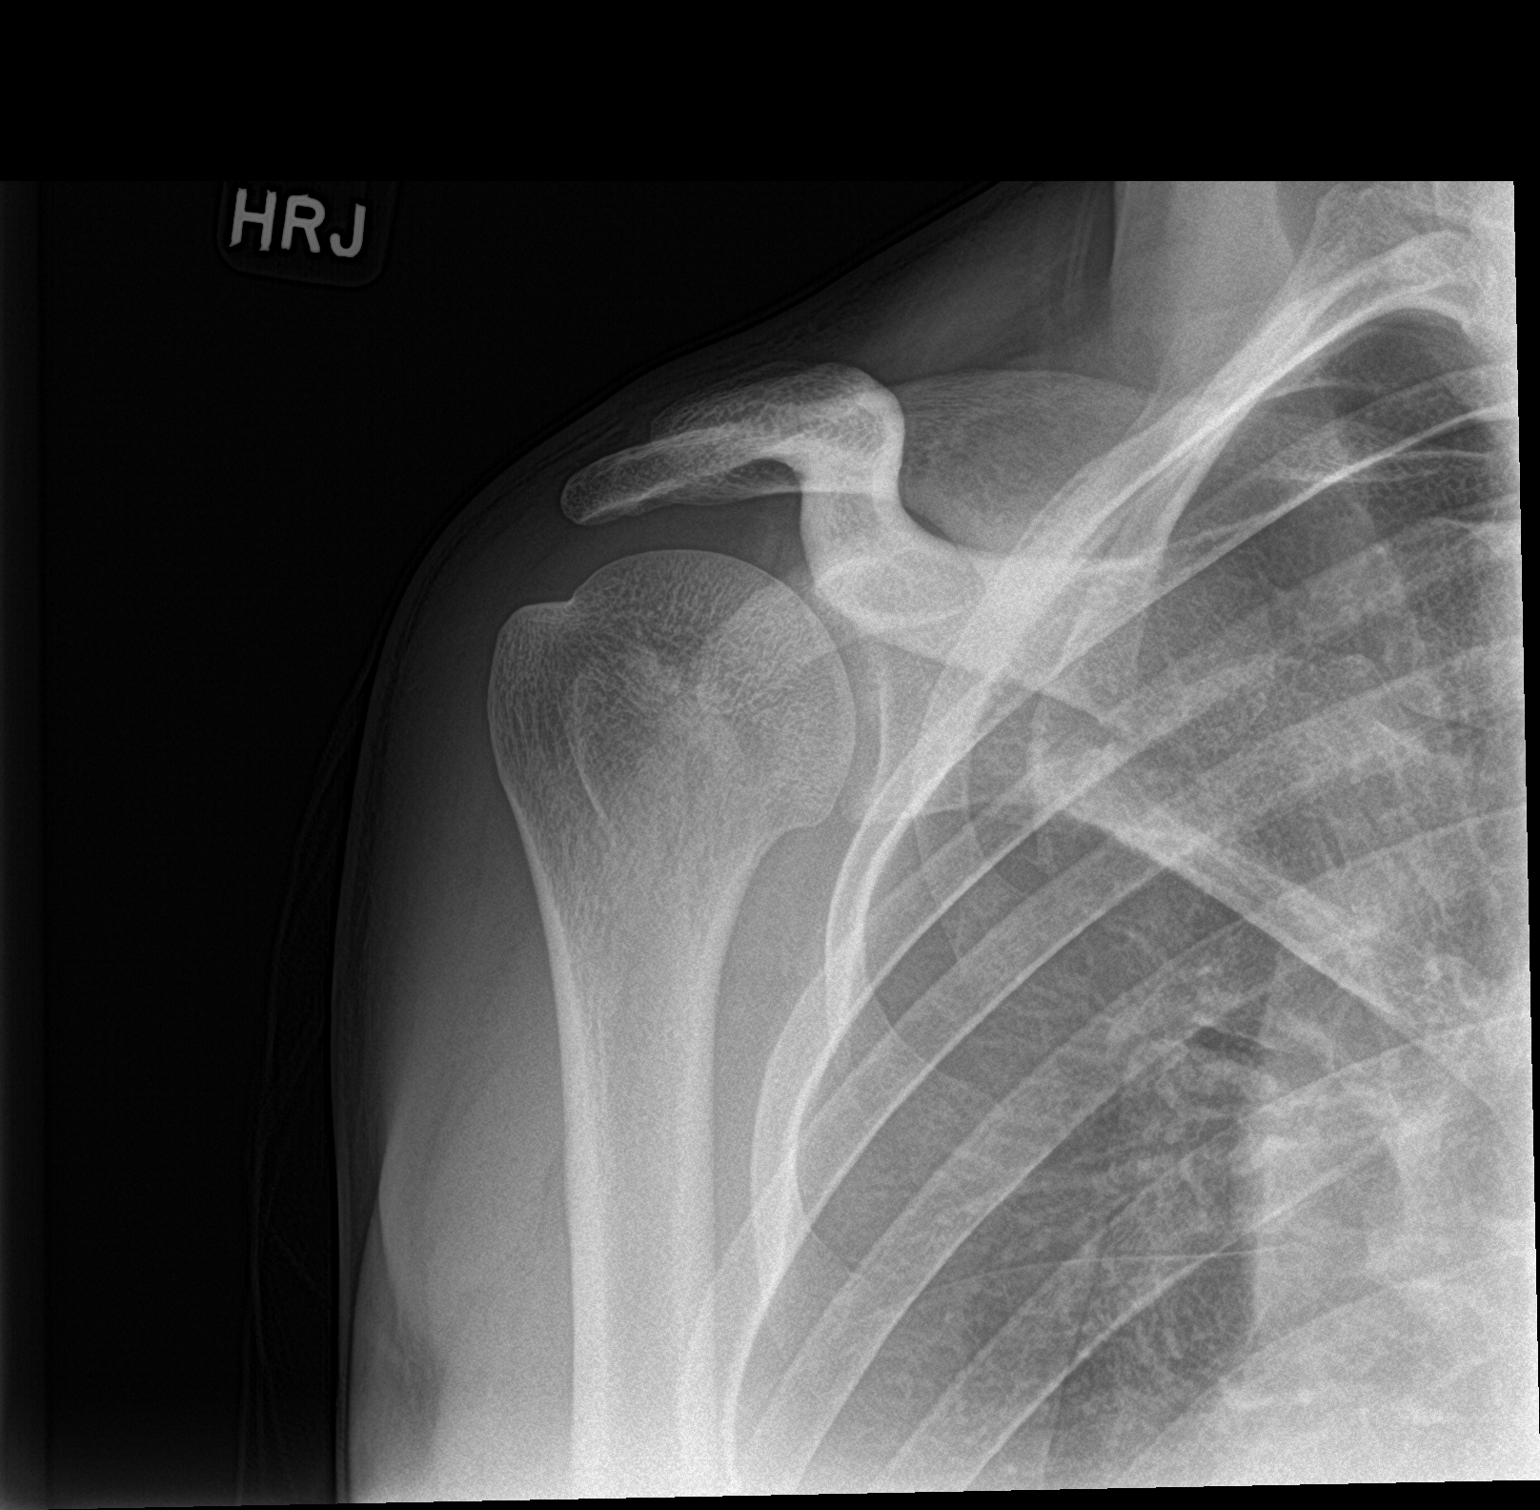

[shoulder y view]
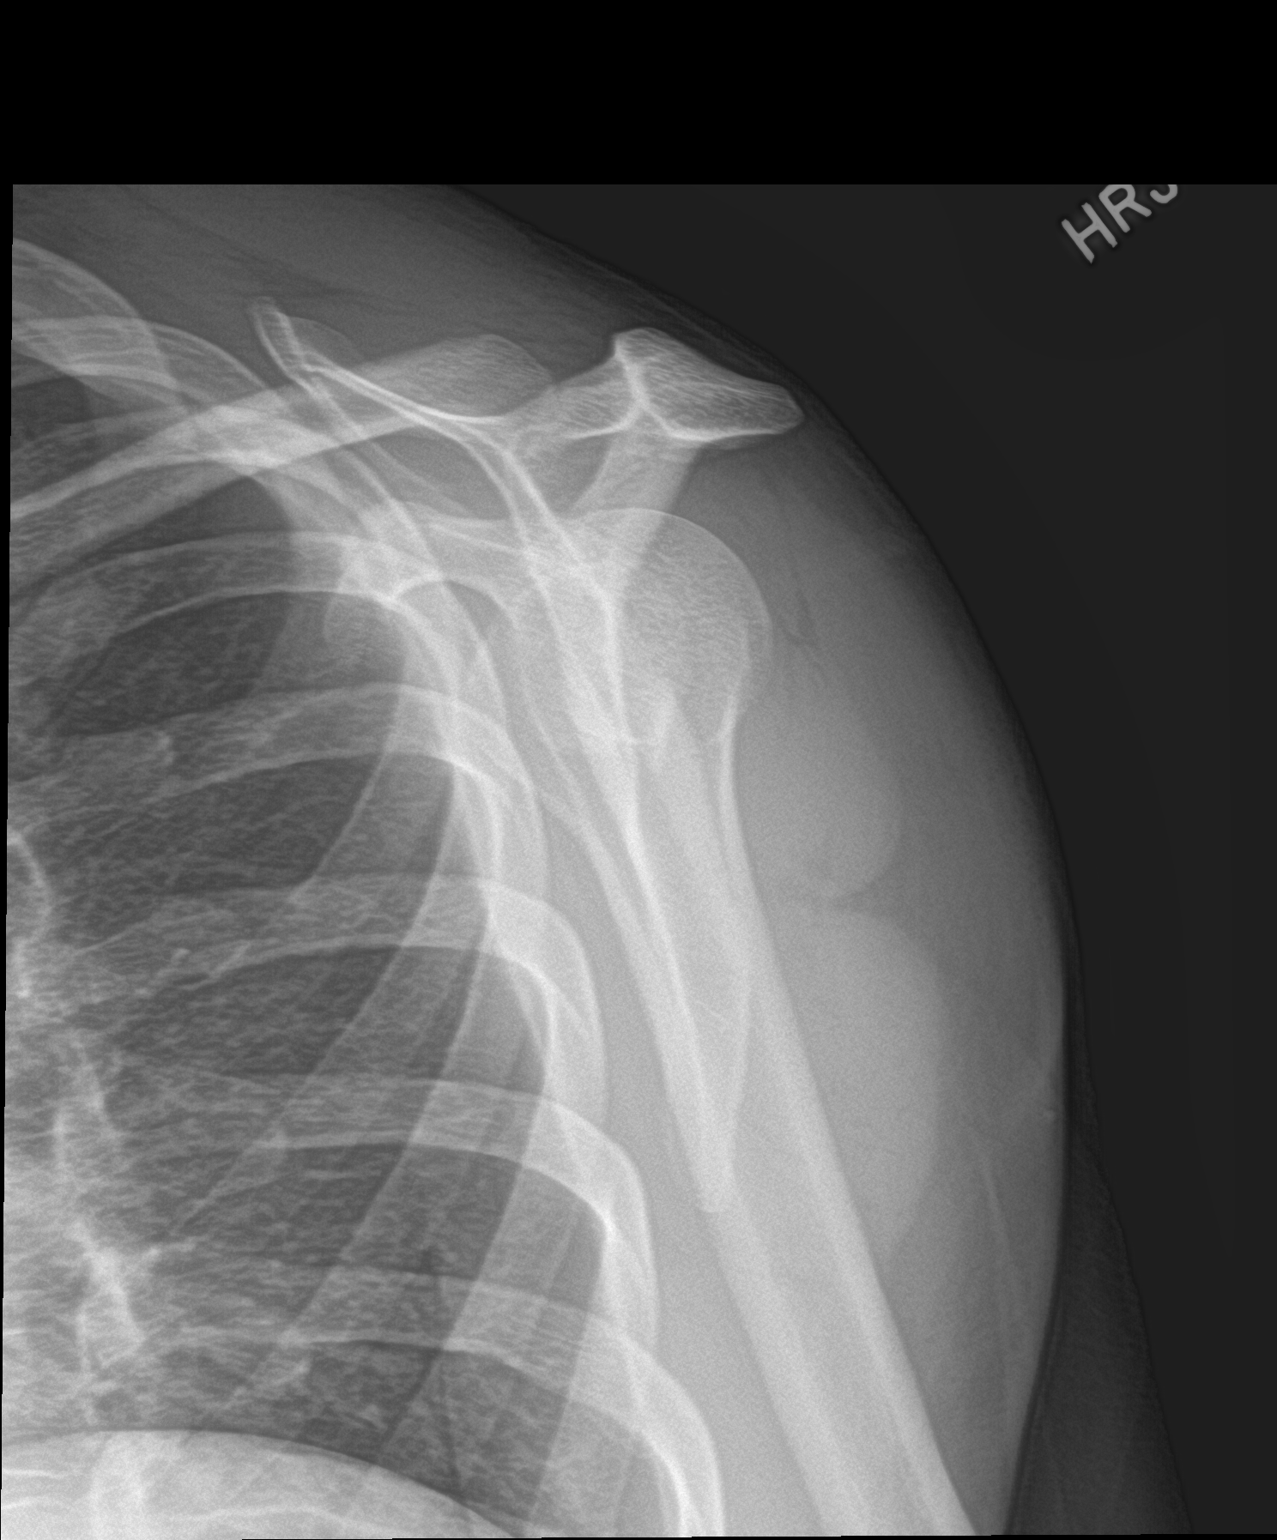

[2 of 2 positions shown; findings below may reference images not displayed]

FINDINGS: Shoulder is located. Humeral head fracture is less well seen on
these views. Visualized hemithorax is clear. Visualized clavicle is
within normal limits.
IMPRESSION: 1. The shoulder is located.
2. No acute abnormality.

## 2020-06-24 IMAGING — CR DG SHOULDER 2+V*R*
2 series · 2 of 2 positions shown · non-contrast
Comparison: Multiple recent prior exams.

CLINICAL DATA: Right shoulder pain, dislocation history.

EXAM:
RIGHT SHOULDER - 2+ VIEW

[w shoulder y-view right (1 of 2)]
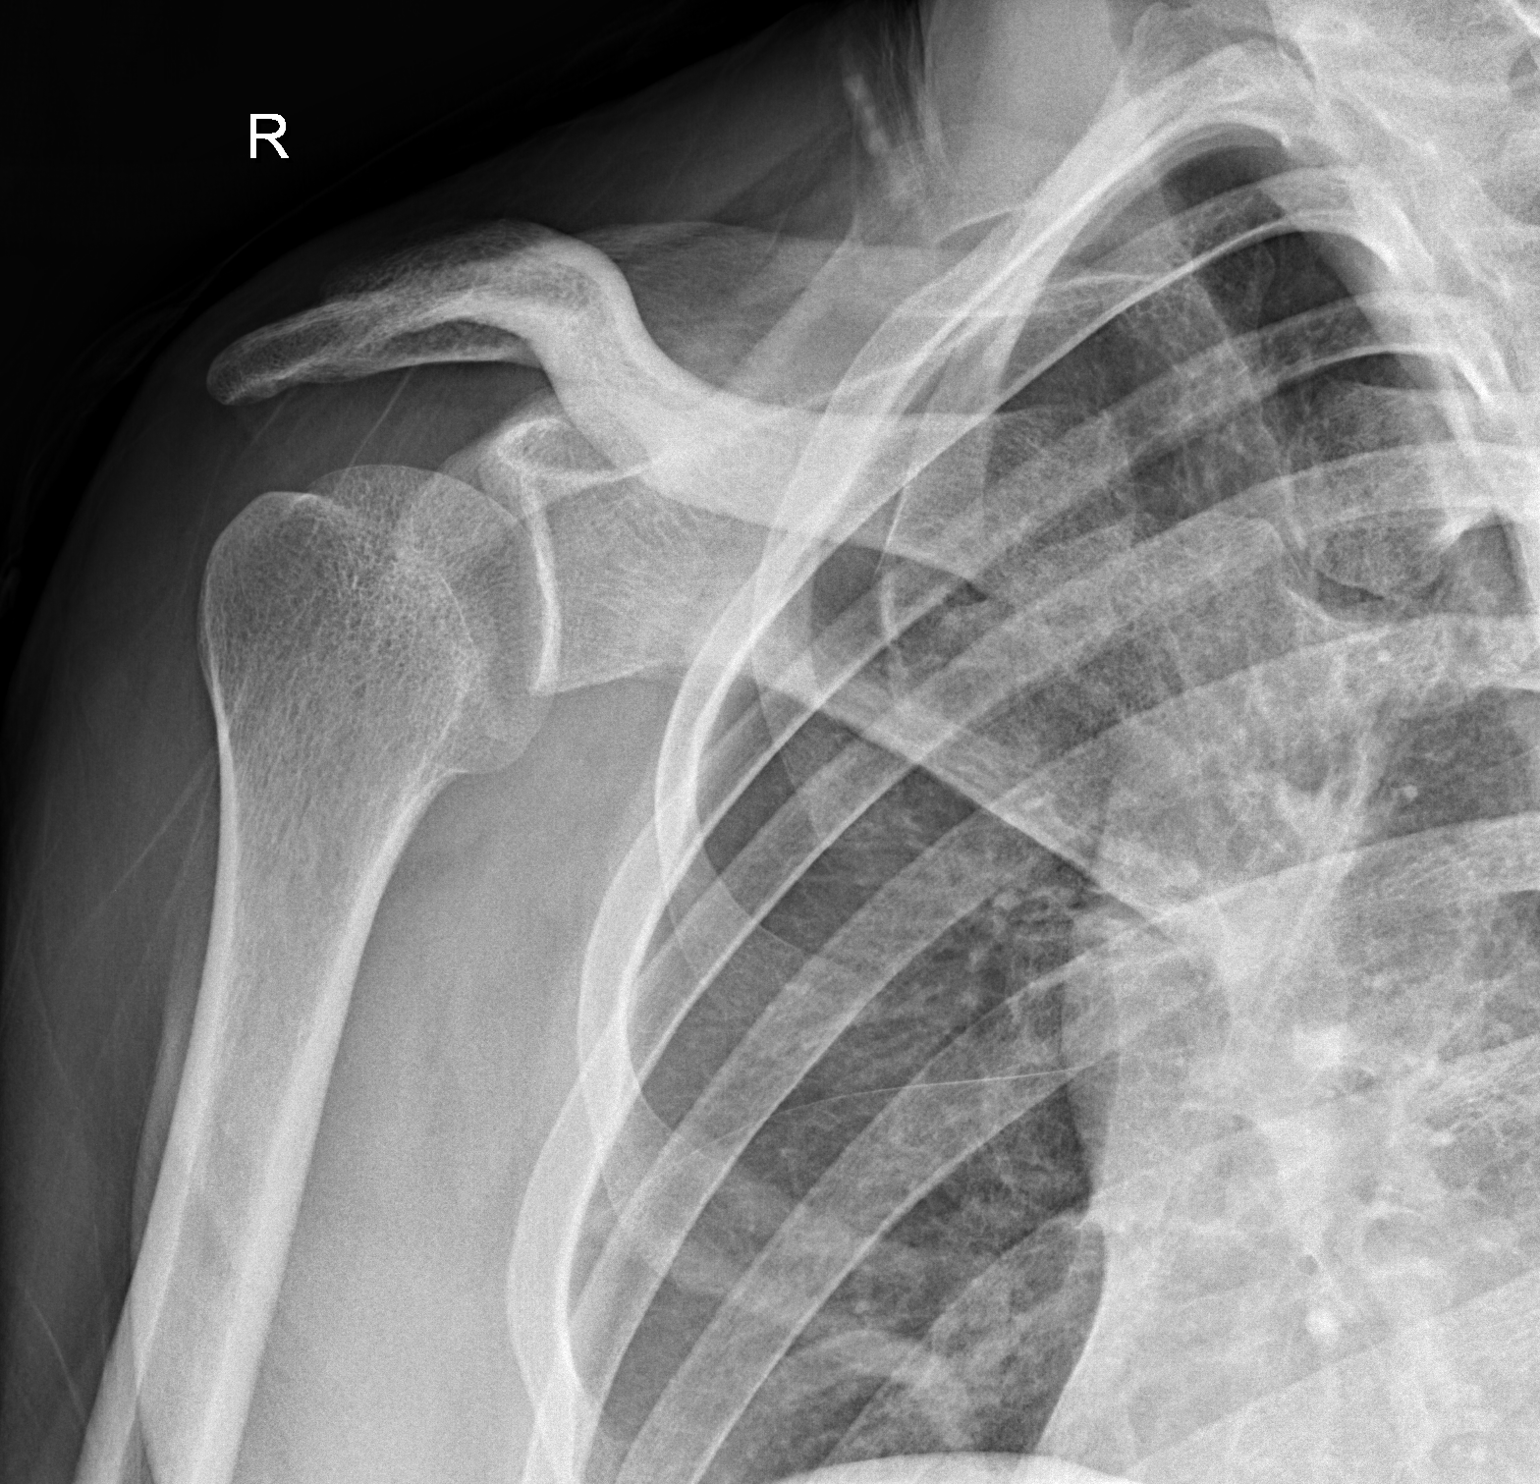

[w shoulder y-view right (2 of 2)]
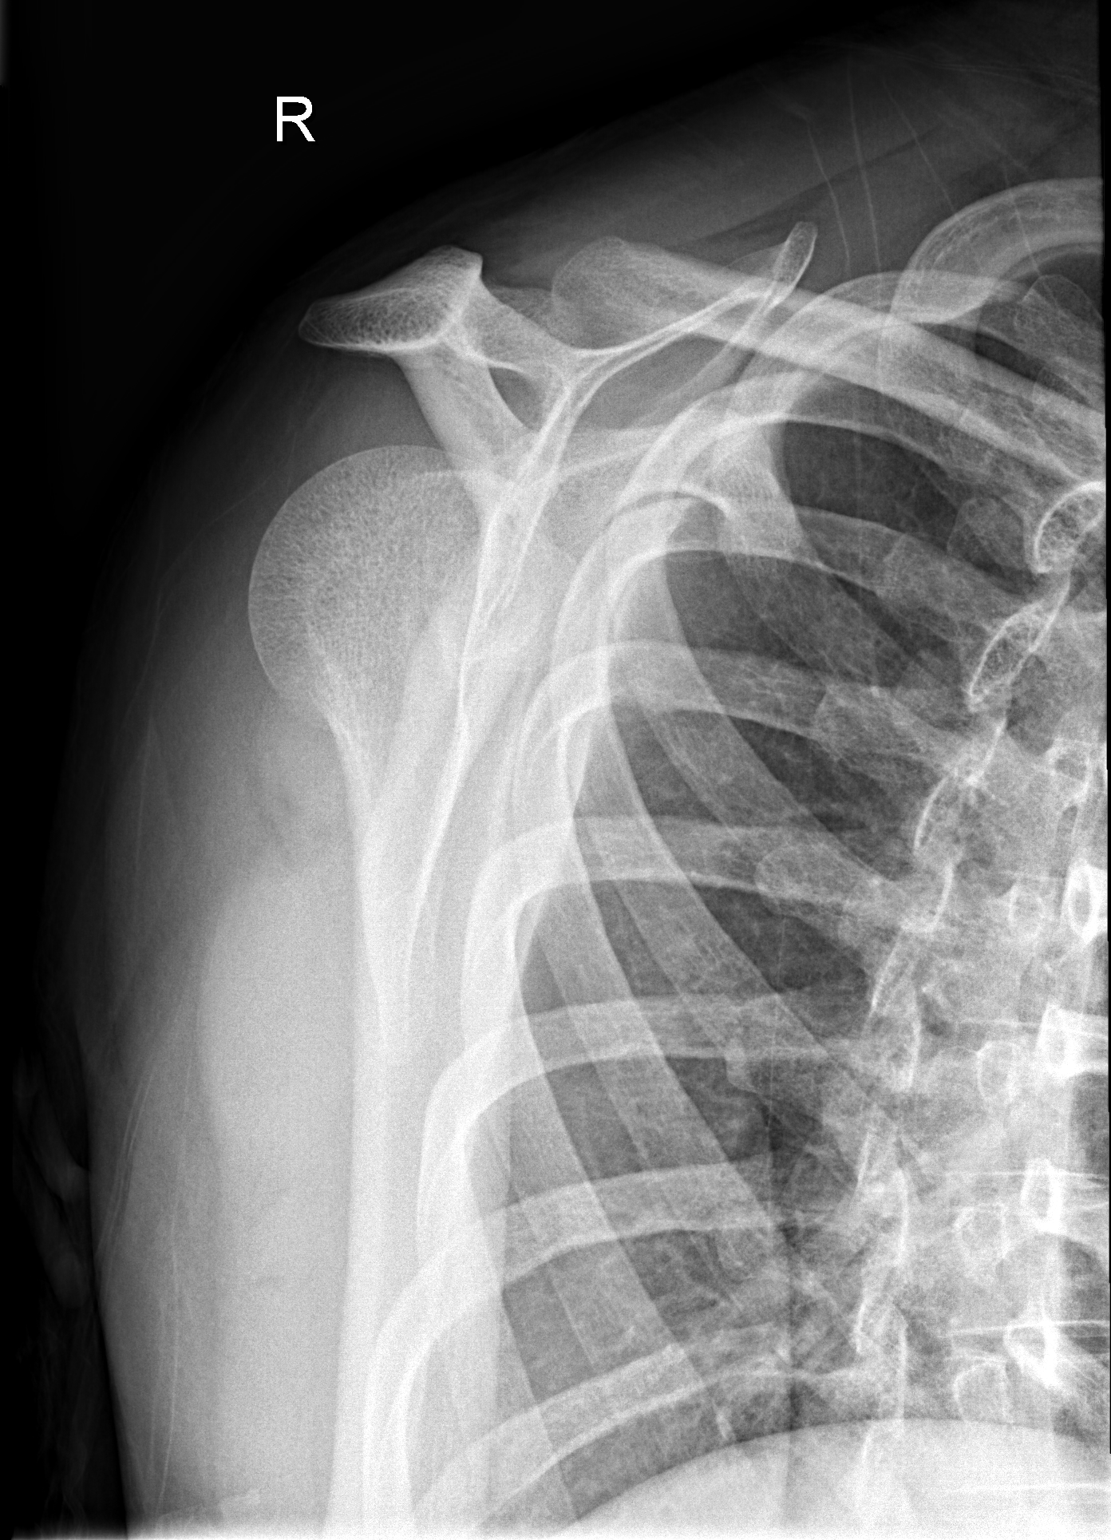

[2 of 2 positions shown; findings below may reference images not displayed]

FINDINGS: Overlapping appearance of humeral head and glenoid suggests
persistent posterior humeral dislocation and/or subluxation to to at
least some extent in this patient with known reverse Hill-Sachs
lesion. No new fracture or bone abnormality is demonstrated.
IMPRESSION: Persistent posterior dislocation and/or subluxation of the humerus
with known reverse Hill-Sachs lesion.

## 2020-06-27 IMAGING — DX DG SHOULDER 2+V PORT*R*
1 series · 1 of 1 positions shown · non-contrast
Comparison: 02/28/2019 right shoulder radiographs

CLINICAL DATA: Right shoulder arthroplasty

EXAM:
PORTABLE RIGHT SHOULDER

[shoulder ap]
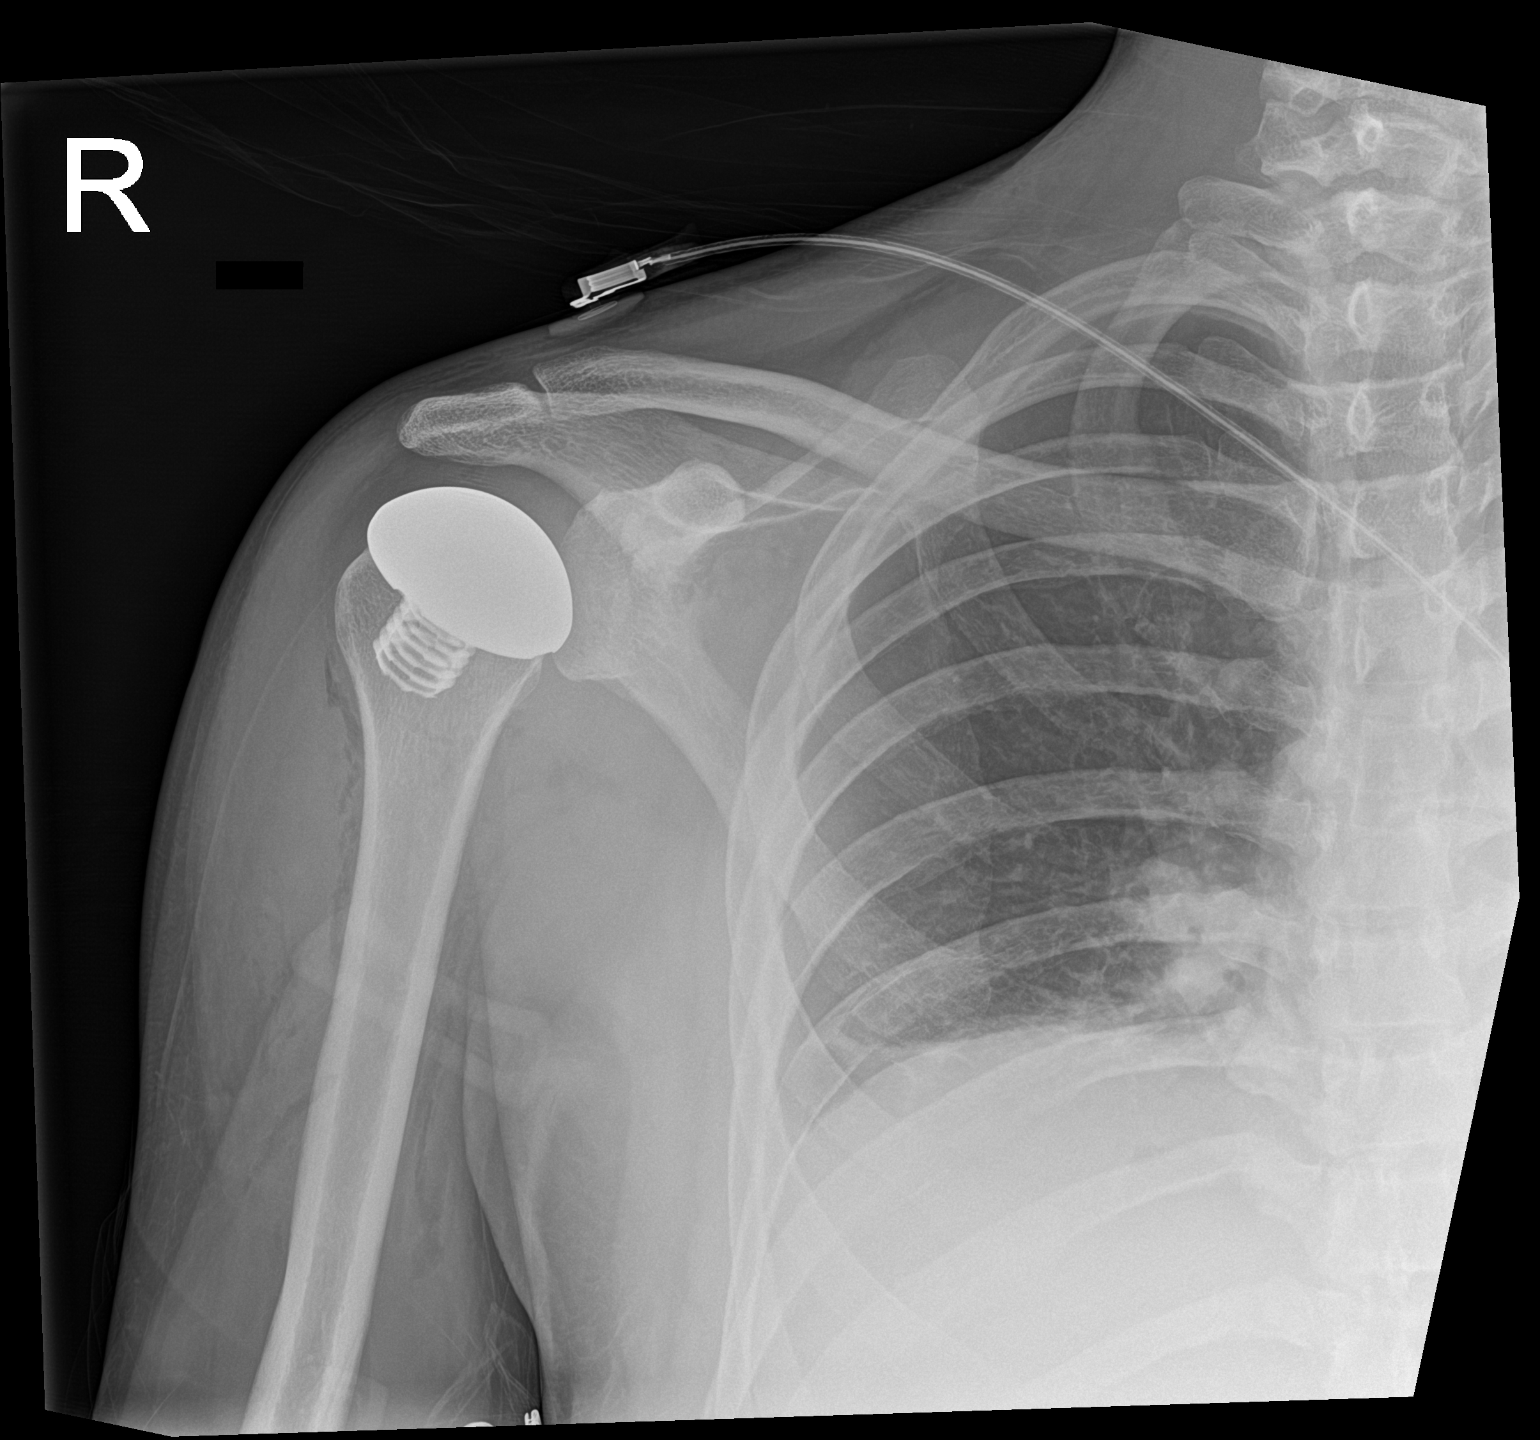

[1 of 1 positions shown; findings below may reference images not displayed]

FINDINGS: Interval right shoulder hemiarthroplasty with well-positioned right
humeral head prosthesis. No evident dislocation at the glenohumeral
joint on this single frontal view. No malalignment at the
acromioclavicular joint. Expected soft tissue gas surrounding the
right shoulder. No osseous fracture. No suspicious focal osseous
lesions.
IMPRESSION: Satisfactory immediate postoperative single frontal view appearance
status post right shoulder hemiarthroplasty. When clinically
feasible, consider scapular Y-view of the right shoulder to confirm
proper alignment.

## 2021-01-04 ENCOUNTER — Other Ambulatory Visit (HOSPITAL_COMMUNITY): Payer: Self-pay
